# Patient Record
Sex: Female | Born: 2000 | Race: Black or African American | Hispanic: No | Smoking: Former smoker
Health system: Southern US, Community
[De-identification: ages and names within clinical notes are randomized; demographics above are authoritative.]

## PROBLEM LIST (undated history)

## (undated) DIAGNOSIS — J45909 Unspecified asthma, uncomplicated: Secondary | ICD-10-CM

## (undated) DIAGNOSIS — F329 Major depressive disorder, single episode, unspecified: Secondary | ICD-10-CM

## (undated) DIAGNOSIS — F32A Depression, unspecified: Secondary | ICD-10-CM

## (undated) HISTORY — PX: OTHER SURGICAL HISTORY: SHX169

---
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
## 2019-04-07 ENCOUNTER — Ambulatory Visit: Payer: Medicaid Other

## 2019-04-09 ENCOUNTER — Ambulatory Visit: Payer: Medicaid Other

## 2019-04-14 ENCOUNTER — Ambulatory Visit: Payer: Medicaid Other

## 2019-06-05 ENCOUNTER — Encounter: Payer: Self-pay | Admitting: Emergency Medicine

## 2019-06-05 ENCOUNTER — Emergency Department
Admission: EM | Admit: 2019-06-05 | Discharge: 2019-06-05 | Disposition: A | Discharge status: Home / Self Care | Payer: Medicaid Other | Attending: Emergency Medicine | Admitting: Emergency Medicine

## 2019-06-05 ENCOUNTER — Other Ambulatory Visit: Payer: Self-pay

## 2019-06-05 ENCOUNTER — Emergency Department: Payer: Medicaid Other

## 2019-06-05 DIAGNOSIS — J9801 Acute bronchospasm: Secondary | ICD-10-CM | POA: Insufficient documentation

## 2019-06-05 DIAGNOSIS — R509 Fever, unspecified: Secondary | ICD-10-CM | POA: Insufficient documentation

## 2019-06-05 DIAGNOSIS — M791 Myalgia, unspecified site: Secondary | ICD-10-CM | POA: Insufficient documentation

## 2019-06-05 DIAGNOSIS — R232 Flushing: Secondary | ICD-10-CM | POA: Insufficient documentation

## 2019-06-05 DIAGNOSIS — R0981 Nasal congestion: Secondary | ICD-10-CM

## 2019-06-05 DIAGNOSIS — Z79899 Other long term (current) drug therapy: Secondary | ICD-10-CM | POA: Insufficient documentation

## 2019-06-05 DIAGNOSIS — R05 Cough: Secondary | ICD-10-CM | POA: Diagnosis present

## 2019-06-05 MED ORDER — METHYLPREDNISOLONE 4 MG PO TBPK: ORAL_TABLET | ORAL | 0 refills | Status: DC

## 2019-06-05 MED ORDER — BENZONATATE 100 MG PO CAPS: 200.0000 mg | ORAL_CAPSULE | Freq: Three times a day (TID) | ORAL | 0 refills | Status: AC | PRN

## 2019-06-05 MED ORDER — ALBUTEROL SULFATE HFA 108 (90 BASE) MCG/ACT IN AERS: 2.0000 | INHALATION_SPRAY | Freq: Four times a day (QID) | RESPIRATORY_TRACT | 1 refills | Status: DC | PRN

## 2019-06-05 MED ORDER — FEXOFENADINE-PSEUDOEPHED ER 60-120 MG PO TB12: 1.0000 | ORAL_TABLET | Freq: Two times a day (BID) | ORAL | 0 refills | Status: DC

## 2019-06-05 NOTE — ED Triage Notes (Signed)
Pt reports cough, SOB and feeling hot since yesterday. Pt denies recent exposures and is unsure of fevers.

## 2019-06-05 NOTE — ED Notes (Signed)
See triage note  Presents with prod cough and some discomfort in chest  Esp with inspiration  Unsure of fever but did have "hot" sensation

## 2019-06-05 NOTE — ED Provider Notes (Signed)
[facility] Emergency Department Provider Note   ____________________________________________   First MD Initiated Contact with Patient 06/05/19 [phone]     (approximate)  I have reviewed the triage vital signs and the nursing notes.   HISTORY  Chief Complaint Hot Flashes, Cough, Generalized Body Aches, and Shortness of Breath    HPI Judith Henson is a 18 y.o. female patient presents with 2 days of productive cough, chest congestion, and subjective fever.  Patient denies nausea, vomiting, diarrhea.  Patient denies recent travel or known exposure to COVID-19.  Patient rates her pain discomfort 8/10.  Patient described the pain is "achy".  No palliative measures for complaint.         Past Medical History:  Diagnosis Date  . Asthma   . Depression     Patient Active Problem List   Diagnosis Date Noted  . MDD (major depressive disorder) 06/09/2015    Past Surgical History:  Procedure Laterality Date  . left wrist surgery      Prior to Admission medications   Medication Sig Start Date End Date Taking? Authorizing Provider  benzonatate (TESSALON PERLES) 100 MG capsule Take 2 capsules (200 mg total) by mouth 3 (three) times daily as needed. 06/05/19 06/04/20  Sable Feil, PA-C  etonogestrel (NEXPLANON) 68 MG IMPL implant 1 each by Subdermal route once. 12/07/17   Caren Macadam, MD  fexofenadine-pseudoephedrine (ALLEGRA-D) 60-120 MG 12 hr tablet Take 1 tablet by mouth 2 (two) times daily. 06/05/19   Sable Feil, PA-C  methylPREDNISolone (MEDROL DOSEPAK) 4 MG TBPK tablet Take Tapered dose as directed 06/05/19   Sable Feil, PA-C    Allergies Patient has no known allergies.  No family history on file.  Social History Social History   Tobacco Use  . Smoking status: Never Smoker  . Smokeless tobacco: Never Used  Substance Use Topics  . Alcohol use: No  . Drug use: No    Review of Systems Constitutional: No fever/chills  Eyes: No visual changes. ENT: No sore throat.  Nasal congestion. Cardiovascular: Denies chest pain. Respiratory: Mild shortness of breath and productive cough. Gastrointestinal: No abdominal pain.  No nausea, no vomiting.  No diarrhea.  No constipation. Genitourinary: Negative for dysuria. Musculoskeletal: Negative for back pain. Skin: Negative for rash. Neurological: Negative for headaches, focal weakness or numbness.   ____________________________________________   PHYSICAL EXAM:  VITAL SIGNS: ED Triage Vitals  Enc Vitals Group     BP 06/05/19 0806 136/70     Pulse Rate 06/05/19 0806 94     Resp --      Temp 06/05/19 0806 98.9 F (37.2 C)     Temp Source 06/05/19 0806 Oral     SpO2 06/05/19 0806 94 %     Weight 06/05/19 0805 120 lb (54.4 kg)     Height 06/05/19 0805 5\' 5"  (1.651 m)     Head Circumference --      Peak Flow --      Pain Score 06/05/19 0805 8     Pain Loc --      Pain Edu? --      Excl. in [place]? --     Constitutional: Alert and oriented. Well appearing and in no acute distress. Nose: Edematous nasal turbinates. Mouth/Throat: Mucous membranes are moist.  Oropharynx non-erythematous.  Postnasal drainage. Neck: No stridor.  Hematological/Lymphatic/Immunilogical: No cervical lymphadenopathy. Cardiovascular: Normal rate, regular rhythm. Grossly normal heart sounds.  Good peripheral circulation. Respiratory: Cough with deep inspirations.  No retractions. Lungs bilateral rales.   Gastrointestinal: Soft and nontender. No distention. No abdominal bruits. No CVA tenderness. Musculoskeletal: No lower extremity tenderness nor edema.  No joint effusions. Neurologic:  Normal speech and language. No gross focal neurologic deficits are appreciated. No gait instability. Skin:  Skin is warm, dry and intact. No rash noted. Psychiatric: Mood and affect are normal. Speech and behavior are normal.  ____________________________________________   LABS (all labs ordered are  listed, but only abnormal results are displayed)  Labs Reviewed - No data to display ____________________________________________  EKG   ____________________________________________  RADIOLOGY  ED MD interpretation:    Official radiology report(s): Dg Chest 2 View  Result Date: 06/05/2019 CLINICAL DATA:  Productive cough.  Fever.  Abnormal breath sounds. EXAM: CHEST - 2 VIEW COMPARISON:  No prior. FINDINGS: Mediastinum hilar structures normal. Very mild peribronchial cuffing noted. Bronchitis cannot be excluded. No focal alveolar infiltrate. No pleural effusion or pneumothorax. S shaped thoracic spine scoliosis. No acute bony abnormality. IMPRESSION: Very mild peribronchial cuffing noted. Bronchitis cannot be excluded. No focal alveolar infiltrate identified. Electronically Signed   By: Maisie Fus  Register   On: 06/05/2019 09:13    ____________________________________________   PROCEDURES  Procedure(s) performed (including Critical Care):  Procedures   ____________________________________________   INITIAL IMPRESSION / ASSESSMENT AND PLAN / ED COURSE  As part of my medical decision making, I reviewed the following data within the electronic MEDICAL RECORD NUMBER         Judith Henson was evaluated in Emergency Department on 06/05/2019 for the symptoms described in the history of present illness. She was evaluated in the context of the global COVID-19 pandemic, which necessitated consideration that the patient might be at risk for infection with the SARS-CoV-2 virus that causes COVID-19. Institutional protocols and algorithms that pertain to the evaluation of patients at risk for COVID-19 are in a state of rapid change based on information released by regulatory bodies including the CDC and federal and state organizations. These policies and algorithms were followed during the patient's care in the ED.  Patient presents with 2 days of productive cough and chest congestion.   Discussed x-ray findings with patient consistent with bronchitis.  Patient given discharge care instruction work note.  Patient vies take medication as directed.  Patient [facility].      ____________________________________________   FINAL CLINICAL IMPRESSION(S) / ED DIAGNOSES  Final diagnoses:  Cough due to bronchospasm  Congestion of nasal sinus     ED Discharge Orders         Ordered    methylPREDNISolone (MEDROL DOSEPAK) 4 MG TBPK tablet     06/05/19 0920    benzonatate (TESSALON PERLES) 100 MG capsule  3 times daily PRN     06/05/19 0920    fexofenadine-pseudoephedrine (ALLEGRA-D) 60-120 MG 12 hr tablet  2 times daily     06/05/19 0920           Note:  This document was prepared using Dragon voice recognition software and may include unintentional dictation errors.    Joni Reining, PA-C 06/05/19 5597    Chesley Noon, MD 06/05/19 1550

## 2019-06-05 NOTE — Discharge Instructions (Signed)
Take medication as directed.

## 2019-06-26 ENCOUNTER — Other Ambulatory Visit: Payer: Self-pay

## 2019-06-26 ENCOUNTER — Telehealth: Payer: Medicaid Other

## 2019-06-26 ENCOUNTER — Emergency Department
Admission: EM | Admit: 2019-06-26 | Discharge: 2019-06-27 | Disposition: A | Discharge status: Home / Self Care | Payer: Medicaid Other | Attending: Emergency Medicine | Admitting: Emergency Medicine

## 2019-06-26 ENCOUNTER — Emergency Department: Payer: Medicaid Other

## 2019-06-26 DIAGNOSIS — R05 Cough: Secondary | ICD-10-CM | POA: Diagnosis present

## 2019-06-26 DIAGNOSIS — Z20828 Contact with and (suspected) exposure to other viral communicable diseases: Secondary | ICD-10-CM | POA: Diagnosis not present

## 2019-06-26 DIAGNOSIS — J45909 Unspecified asthma, uncomplicated: Secondary | ICD-10-CM | POA: Diagnosis not present

## 2019-06-26 DIAGNOSIS — Z79899 Other long term (current) drug therapy: Secondary | ICD-10-CM | POA: Insufficient documentation

## 2019-06-26 DIAGNOSIS — J069 Acute upper respiratory infection, unspecified: Secondary | ICD-10-CM | POA: Insufficient documentation

## 2019-06-26 DIAGNOSIS — Z20822 Contact with and (suspected) exposure to covid-19: Secondary | ICD-10-CM

## 2019-06-26 MED ORDER — PREDNISONE 20 MG PO TABS
60.0000 mg | ORAL_TABLET | Freq: Once | ORAL | Status: AC
  Administered 2019-06-27: 60 mg via ORAL
  Filled 2019-06-26: qty 3

## 2019-06-26 MED ORDER — AZITHROMYCIN 500 MG PO TABS
500.0000 mg | ORAL_TABLET | Freq: Once | ORAL | Status: AC
  Administered 2019-06-27: 500 mg via ORAL
  Filled 2019-06-26: qty 1

## 2019-06-26 MED ORDER — IPRATROPIUM-ALBUTEROL 0.5-2.5 (3) MG/3ML IN SOLN: 3.0000 mL | Freq: Once | RESPIRATORY_TRACT | Status: DC

## 2019-06-26 NOTE — ED Triage Notes (Addendum)
Pt in [place] cough and shob, dx with bronchitis 2 weeks ago. Was given steroids but no antibiotics. Pt had covid test done this am at the drive through, pending results.

## 2019-06-27 LAB — POC SARS CORONAVIRUS 2 AG: SARS Coronavirus 2 Ag: NEGATIVE

## 2019-06-27 MED ORDER — AZITHROMYCIN 500 MG PO TABS: 500.0000 mg | ORAL_TABLET | Freq: Every day | ORAL | 0 refills | Status: AC

## 2019-06-27 MED ORDER — PREDNISONE 20 MG PO TABS: 60.0000 mg | ORAL_TABLET | Freq: Every day | ORAL | 0 refills | Status: AC

## 2019-06-27 MED ORDER — IPRATROPIUM-ALBUTEROL 20-100 MCG/ACT IN AERS
1.0000 | INHALATION_SPRAY | Freq: Four times a day (QID) | RESPIRATORY_TRACT | Status: DC | PRN
  Administered 2019-06-27: 01:00:00 1 via RESPIRATORY_TRACT
  Filled 2019-06-27: qty 4

## 2019-06-27 MED ORDER — ALBUTEROL SULFATE HFA 108 (90 BASE) MCG/ACT IN AERS: 2.0000 | INHALATION_SPRAY | Freq: Four times a day (QID) | RESPIRATORY_TRACT | 1 refills | Status: DC | PRN

## 2019-06-27 MED ORDER — AZITHROMYCIN 500 MG PO TABS: 500.0000 mg | ORAL_TABLET | Freq: Once | ORAL | Status: DC

## 2019-06-27 MED ORDER — BENZONATATE 100 MG PO CAPS: 100.0000 mg | ORAL_CAPSULE | Freq: Three times a day (TID) | ORAL | 0 refills | Status: AC | PRN

## 2019-06-27 NOTE — ED Provider Notes (Signed)
[facility] Emergency Department Provider Note    First MD Initiated Contact with Patient 06/27/19 0003     (approximate)  I have reviewed the triage vital signs and the nursing notes.   HISTORY  Chief Complaint Cough  HPI Judith Henson is a 18 y.o. female with below list of previous medical conditions presents to the emergency department secondary to cough and dyspnea x2 weeks.  Patient denies any fever or known known sick contact.  Patient was seen in the emergency department and diagnosed with bronchitis for which she was prescribed steroids but no antibiotics or any other treatment.  Patient states that symptoms have been persistent.  Patient denies any chest pain.       Past Medical History:  Diagnosis Date  . Asthma   . Depression     Patient Active Problem List   Diagnosis Date Noted  . MDD (major depressive disorder) 06/09/2015    Past Surgical History:  Procedure Laterality Date  . left wrist surgery      Prior to Admission medications   Medication Sig Start Date End Date Taking? Authorizing Provider  albuterol (VENTOLIN HFA) 108 (90 Base) MCG/ACT inhaler Inhale 2 puffs into the lungs every 6 (six) hours as needed for wheezing or shortness of breath. 06/05/19   Joni Reining, PA-C  benzonatate (TESSALON PERLES) 100 MG capsule Take 2 capsules (200 mg total) by mouth 3 (three) times daily as needed. 06/05/19 06/04/20  Joni Reining, PA-C  etonogestrel (NEXPLANON) 68 MG IMPL implant 1 each by Subdermal route once. 12/07/17   Federico Flake, MD  fexofenadine-pseudoephedrine (ALLEGRA-D) 60-120 MG 12 hr tablet Take 1 tablet by mouth 2 (two) times daily. 06/05/19   Joni Reining, PA-C  methylPREDNISolone (MEDROL DOSEPAK) 4 MG TBPK tablet Take Tapered dose as directed 06/05/19   Joni Reining, PA-C    Allergies Patient has no known allergies.  No family history on file.  Social History Social History   Tobacco Use  .  Smoking status: Never Smoker  . Smokeless tobacco: Never Used  Substance Use Topics  . Alcohol use: No  . Drug use: No    Review of Systems Constitutional: No fever/chills Eyes: No visual changes. ENT: No sore throat. Cardiovascular: Denies chest pain. Respiratory: Positive for dyspnea and cough Gastrointestinal: No abdominal pain.  No nausea, no vomiting.  No diarrhea.  No constipation. Genitourinary: Negative for dysuria. Musculoskeletal: Negative for neck pain.  Negative for back pain. Integumentary: Negative for rash. Neurological: Negative for headaches, focal weakness or numbness.  ____________________________________________   PHYSICAL EXAM:  VITAL SIGNS: ED Triage Vitals  Enc Vitals Group     BP 06/26/19 2104 120/72     Pulse Rate 06/26/19 2104 84     Resp 06/26/19 2104 20     Temp 06/26/19 2104 98.3 F (36.8 C)     Temp Source 06/26/19 2104 Oral     SpO2 06/26/19 2104 95 %     Weight 06/26/19 2104 54.9 kg (121 lb)     Height 06/26/19 2104 1.651 m (5\' 5" )     Head Circumference --      Peak Flow --      Pain Score 06/26/19 2110 0     Pain Loc --      Pain Edu? --      Excl. in [place]? --     Constitutional: Alert and oriented.  Eyes: Conjunctivae are normal.  Mouth/Throat: Patient is wearing a  mask. Neck: No stridor.  No meningeal signs.   Cardiovascular: Normal rate, regular rhythm. Good peripheral circulation. Grossly normal heart sounds. Respiratory: Normal respiratory effort.  Diffuse rhonchi with coarse expiratory wheezing. Gastrointestinal: Soft and nontender. No distention.  Musculoskeletal: No lower extremity tenderness nor edema. No gross deformities of extremities. Neurologic:  Normal speech and language. No gross focal neurologic deficits are appreciated.  Skin:  Skin is warm, dry and intact. Psychiatric: Mood and affect are normal. Speech and behavior are normal.  ____________________________________________   LABS (all labs ordered are  listed, but only abnormal results are displayed)  Labs Reviewed  POC SARS CORONAVIRUS 2 AG -  ED   ____________________________________________  EKG    RADIOLOGY I, [place], personally viewed and evaluated these images (plain radiographs) as part of my medical decision making, as well as reviewing the written report by the radiologist.  ED MD interpretation:    Official radiology report(s): Dg Chest 2 View  Result Date: 06/26/2019 CLINICAL DATA:  Cough EXAM: CHEST - 2 VIEW COMPARISON:  June 05, 2019 FINDINGS: The heart size and mediastinal contours are within normal limits. Both lungs are clear. The visualized skeletal structures are unremarkable. IMPRESSION: No active cardiopulmonary disease. Electronically Signed   By: Prudencio Pair M.D.   On: 06/26/2019 23:48     Procedures   ____________________________________________   INITIAL IMPRESSION / MDM / ASSESSMENT AND PLAN / ED COURSE  As part of my medical decision making, I reviewed the following data within the electronic MEDICAL RECORD NUMBER   18 year old female presented with above-stated history and physical exam concerning for possible URI bronchitis pneumonia as well as COVID-19.  Chest x-ray revealed no active cardiopulmonary disease per radiologist.  Point-of-care Covid testing negative however given patient extended course of symptoms false-negative likelihood is also high.  Patient did have COVID-19 testing performed yesterday and is pending at this time.  Patient given azithromycin and albuterol breathing treatment the emergency department.  Patient will be prescribed albuterol prednisone Tessalon for home.  Patient advised to follow-up on COVID-19 testing.        ____________________________________________  FINAL CLINICAL IMPRESSION(S) / ED DIAGNOSES  Final diagnoses:  Acute upper respiratory infection  Suspected COVID-19 virus infection     MEDICATIONS GIVEN DURING THIS VISIT:  Medications   ipratropium-albuterol (DUONEB) 0.5-2.5 (3) MG/3ML nebulizer solution 3 mL (has no administration in time range)  azithromycin (ZITHROMAX) tablet 500 mg (has no administration in time range)  predniSONE (DELTASONE) tablet 60 mg (has no administration in time range)     ED Discharge Orders    None      *Please note:  Mckynzi Shamoon was evaluated in Emergency Department on 06/27/2019 for the symptoms described in the history of present illness. She was evaluated in the context of the global COVID-19 pandemic, which necessitated consideration that the patient might be at risk for infection with the SARS-CoV-2 virus that causes COVID-19. Institutional protocols and algorithms that pertain to the evaluation of patients at risk for COVID-19 are in a state of rapid change based on information released by regulatory bodies including the CDC and federal and state organizations. These policies and algorithms were followed during the patient's care in the ED.  Some ED evaluations and interventions may be delayed as a result of limited staffing during the pandemic.*  Note:  This document was prepared using Dragon voice recognition software and may include unintentional dictation errors.   Gregor Hams, MD 06/27/19 0200

## 2019-06-27 NOTE — ED Notes (Signed)
covid negative. md notified.

## 2019-06-28 LAB — NOVEL CORONAVIRUS, NAA: SARS-CoV-2, NAA: NOT DETECTED

## 2019-10-08 ENCOUNTER — Other Ambulatory Visit: Payer: Self-pay

## 2019-10-08 ENCOUNTER — Encounter: Payer: Self-pay | Admitting: Physician Assistant

## 2019-10-08 ENCOUNTER — Ambulatory Visit: Payer: Medicaid Other

## 2019-10-08 ENCOUNTER — Ambulatory Visit (LOCAL_COMMUNITY_HEALTH_CENTER): Payer: Medicaid Other | Admitting: Physician Assistant

## 2019-10-08 VITALS — BP 116/72 | Ht 65.0 in | Wt 122.2 lb

## 2019-10-08 DIAGNOSIS — Z30013 Encounter for initial prescription of injectable contraceptive: Secondary | ICD-10-CM

## 2019-10-08 DIAGNOSIS — Z3046 Encounter for surveillance of implantable subdermal contraceptive: Secondary | ICD-10-CM | POA: Diagnosis not present

## 2019-10-08 DIAGNOSIS — Z3009 Encounter for other general counseling and advice on contraception: Secondary | ICD-10-CM

## 2019-10-08 MED ORDER — MEDROXYPROGESTERONE ACETATE 150 MG/ML IM SUSP
150.0000 mg | INTRAMUSCULAR | Status: AC
  Administered 2019-10-08: 150 mg via INTRAMUSCULAR

## 2019-10-08 MED ORDER — THERA VITAL M PO TABS: 1.0000 | ORAL_TABLET | Freq: Every day | ORAL | 0 refills | Status: DC

## 2019-10-08 NOTE — Progress Notes (Signed)
Patient given Depo per provider orders. Given right gluteus muscle, tolerated well, next Depo card given. Patient given MVI and PCP list..Eriel Dunckel Babs Sciara, RN

## 2019-10-08 NOTE — Progress Notes (Signed)
Patient here for Nexplanon removal, states menstrual irregularities and mood swings. Considering Depo.Burt Knack, RN

## 2019-10-10 NOTE — Progress Notes (Signed)
Family Planning Visit  Subjective:  Judith Henson is a 19 y.o. being seen today for a Nexplanon removal and to discuss changing to another methos.    She is currently using Nexplanon for pregnancy prevention. Patient reports she does not  want a pregnancy in the next year. Patient  has MDD (major depressive disorder) on their problem list.  Chief Complaint  Patient presents with  . Contraception    Patient reports that she has had irregular bleeding and moodiness with the Nexplanon.  States that she has had one Depo, prior to having the Nexplanon placed.  States that she does not think she would be able to keep up with taking OCs.  Patient denies any changes to her history; no chronic conditions, meds, smoking, clotting, migraines.  Denies vaginal symptoms and declines STD screening today.   Does the patient desire a pregnancy in the next year? (OKQ flowsheet)  See flowsheet for other program required questions.   Body mass index is 20.34 kg/m. - Patient is eligible for diabetes screening based on BMI and age >24?  not applicable MW1U ordered? not applicable  Patient reports 1 of partners in last year. Desires STI screening?  No - .  Does the patient have a current or past history of drug use? No   No components found for: HCV]   Health Maintenance Due  Topic Date Due  . TETANUS/TDAP  Never done    Review of Systems  All other systems reviewed and are negative.   The following portions of the patient's history were reviewed and updated as appropriate: allergies, current medications, past family history, past medical history, past social history, past surgical history and problem list. Problem list updated.  Objective:   Vitals:   10/08/19 0942  BP: 116/72  Weight: 122 lb 3.2 oz (55.4 kg)  Height: 5\' 5"  (1.651 m)    Physical Exam Constitutional:      General: She is not in acute distress.    Appearance: Normal appearance. She is normal weight.  HENT:     Head:  Normocephalic and atraumatic.  Eyes:     Conjunctiva/sclera: Conjunctivae normal.  Pulmonary:     Effort: Pulmonary effort is normal.  Neurological:     Mental Status: She is alert and oriented to person, place, and time.  Psychiatric:        Mood and Affect: Mood normal.        Behavior: Behavior normal.        Thought Content: Thought content normal.        Judgment: Judgment normal.       Assessment and Plan:  Judith Henson is a 19 y.o. female presenting to the [facility] Department for a family planning visit  Contraception counseling: Reviewed all forms of birth control options in the tiered based approach. available including abstinence; over the counter/barrier methods; hormonal contraceptive medication including pill, patch, ring, injection,contraceptive implant; hormonal and nonhormonal IUDs; permanent sterilization options including vasectomy and the various tubal sterilization modalities. Risks, benefits, and typical effectiveness rates were reviewed.  Questions were answered.  Written information was also given to the patient to review.  Patient desires to start Depo, this was prescribed for patient. She will follow up in  3 months and prn for surveillance.  She was told to call with any further questions, or with any concerns about this method of contraception.  Emphasized use of condoms 100% of the time for STI prevention.  Patient was not a  candidate for ECP today.    1. Encounter for counseling regarding contraception Counseled patient re:  BCMs and differences in Nexplanon and Depo. Patient opts to get Depo to use as BCM since does not think she could take OC correctly. Rec condoms with all sex, and especially for 2 weeks after first shot. - medroxyPROGESTERone (DEPO-PROVERA) injection 150 mg  2. Nexplanon removal Nexplanon Removal Patient identified, informed consent performed, consent signed.   Appropriate time out taken. Nexplanon site identified.   Area prepped in usual sterile fashon. 3 ml of 1% lidocaine with Epinephrine was used to anesthetize the area at the distal end of the implant and along implant site. A small stab incision was made right beside the implant on the distal portion.  The Nexplanon rod was grasped manuallyand removed without difficulty.  There was minimal blood loss. There were no complications.  Steri-strips were applied over the small incision.  A pressure bandage was applied to reduce any bruising.  The patient tolerated the procedure well and was given post procedure instructions.    Nexplanon:   Counseled patient to take OTC analgesic starting as soon as lidocaine starts to wear off and take regularly for at least 48 hr to decrease discomfort.  Specifically to take with food or milk to decrease stomach upset and for IB 600 mg (3 tablets) every 6 hrs; IB 800 mg (4 tablets) every 8 hrs; or Aleve 2 tablets every 12 hrs.   - Multiple Vitamins-Minerals (MULTIVITAMIN) tablet; Take 1 tablet by mouth daily.  Dispense: 100 tablet; Refill: 0  3. Initiation of Depo Provera OK to start Depo 150mg  IM q 11-13 weeks for 1 year. Counseled patient re:  Normal SE and when to call clinic with irregular bleeding. Counseled patient that sometimes hormonal BCM can affect B vitamins and these can affect mood.  Rec that patient take MVI 1 po daily to see if this will help with moodiness. Also, offer LCSW info, but patient declines at this time. - medroxyPROGESTERone (DEPO-PROVERA) injection 150 mg - Multiple Vitamins-Minerals (MULTIVITAMIN) tablet; Take 1 tablet by mouth daily.  Dispense: 100 tablet; Refill: 0     No follow-ups on file.  No future appointments.  , PA

## 2019-12-07 ENCOUNTER — Other Ambulatory Visit: Payer: Self-pay

## 2019-12-07 ENCOUNTER — Emergency Department
Admission: EM | Admit: 2019-12-07 | Discharge: 2019-12-07 | Disposition: A | Discharge status: Home / Self Care | Payer: Medicaid Other | Attending: Emergency Medicine | Admitting: Emergency Medicine

## 2019-12-07 ENCOUNTER — Encounter: Payer: Self-pay | Admitting: Emergency Medicine

## 2019-12-07 DIAGNOSIS — Y939 Activity, unspecified: Secondary | ICD-10-CM | POA: Insufficient documentation

## 2019-12-07 DIAGNOSIS — M7918 Myalgia, other site: Secondary | ICD-10-CM | POA: Insufficient documentation

## 2019-12-07 DIAGNOSIS — Y999 Unspecified external cause status: Secondary | ICD-10-CM | POA: Insufficient documentation

## 2019-12-07 DIAGNOSIS — J45909 Unspecified asthma, uncomplicated: Secondary | ICD-10-CM | POA: Insufficient documentation

## 2019-12-07 DIAGNOSIS — Y92410 Unspecified street and highway as the place of occurrence of the external cause: Secondary | ICD-10-CM | POA: Diagnosis not present

## 2019-12-07 LAB — URINALYSIS, COMPLETE (UACMP) WITH MICROSCOPIC
Bilirubin Urine: NEGATIVE
Glucose, UA: NEGATIVE mg/dL
Hgb urine dipstick: NEGATIVE
Ketones, ur: NEGATIVE mg/dL
Leukocytes,Ua: NEGATIVE
Nitrite: NEGATIVE
Protein, ur: 30 mg/dL — AB
Specific Gravity, Urine: 1.017 (ref 1.005–1.030)
pH: 6 (ref 5.0–8.0)

## 2019-12-07 LAB — POCT PREGNANCY, URINE: Preg Test, Ur: NEGATIVE

## 2019-12-07 MED ORDER — CYCLOBENZAPRINE HCL 5 MG PO TABS: 5.0000 mg | ORAL_TABLET | Freq: Three times a day (TID) | ORAL | 0 refills | Status: DC | PRN

## 2019-12-07 MED ORDER — LIDOCAINE 5 % EX PTCH: 1.0000 | MEDICATED_PATCH | CUTANEOUS | 0 refills | Status: AC
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
## 2020-02-12 ENCOUNTER — Ambulatory Visit: Payer: Medicaid Other | Admitting: Obstetrics

## 2020-02-29 ENCOUNTER — Emergency Department
Admission: EM | Admit: 2020-02-29 | Discharge: 2020-02-29 | Discharge status: Against Medical Advice | Payer: No Typology Code available for payment source

## 2020-10-12 ENCOUNTER — Emergency Department
Admission: EM | Admit: 2020-10-12 | Discharge: 2020-10-12 | Disposition: A | Discharge status: Home / Self Care | Payer: Medicaid Other | Attending: Emergency Medicine | Admitting: Emergency Medicine

## 2020-10-12 ENCOUNTER — Other Ambulatory Visit: Payer: Self-pay

## 2020-10-12 ENCOUNTER — Emergency Department: Payer: Medicaid Other

## 2020-10-12 DIAGNOSIS — J209 Acute bronchitis, unspecified: Secondary | ICD-10-CM

## 2020-10-12 DIAGNOSIS — Z20822 Contact with and (suspected) exposure to covid-19: Secondary | ICD-10-CM | POA: Diagnosis not present

## 2020-10-12 DIAGNOSIS — J45909 Unspecified asthma, uncomplicated: Secondary | ICD-10-CM | POA: Insufficient documentation

## 2020-10-12 DIAGNOSIS — R059 Cough, unspecified: Secondary | ICD-10-CM | POA: Diagnosis present

## 2020-10-12 LAB — RESP PANEL BY RT-PCR (FLU A&B, COVID) ARPGX2
Influenza A by PCR: NEGATIVE
Influenza B by PCR: NEGATIVE
SARS Coronavirus 2 by RT PCR: NEGATIVE

## 2020-10-12 MED ORDER — ALBUTEROL SULFATE HFA 108 (90 BASE) MCG/ACT IN AERS: 2.0000 | INHALATION_SPRAY | Freq: Four times a day (QID) | RESPIRATORY_TRACT | 2 refills | Status: DC | PRN

## 2020-10-12 MED ORDER — IPRATROPIUM-ALBUTEROL 0.5-2.5 (3) MG/3ML IN SOLN
3.0000 mL | Freq: Once | RESPIRATORY_TRACT | Status: AC
  Administered 2020-10-12: 3 mL via RESPIRATORY_TRACT
  Filled 2020-10-12: qty 3

## 2020-10-12 MED ORDER — AZITHROMYCIN 250 MG PO TABS: ORAL_TABLET | ORAL | 0 refills | Status: DC

## 2020-10-12 NOTE — ED Notes (Signed)
See triage note  Presents with some SOB and cough  States she thought it was her allergies   But states she doesn't feel any better  No fever

## 2020-10-12 NOTE — ED Triage Notes (Signed)
Pt comes with c/o cough and allergy issues for few days. Pt states her allergies have been acting up. Pt states dry cough. Pt denies any fevers.

## 2020-10-12 NOTE — ED Provider Notes (Signed)
[facility] Emergency Department Provider Note  ____________________________________________   Event Date/Time   First MD Initiated Contact with Patient 10/12/20 1440     (approximate)  I have reviewed the triage vital signs and the nursing notes.   HISTORY  Chief Complaint Cough    HPI Judith Henson is a 20 y.o. female presents to the emergency department with URI symptoms for 3-4 days.   Is complaining of cough, congestion, denies fever, denies chills, denies chest pain, positive wheezing and shortness of breath denies close contact with Covid19+ patient, patient states she used to have asthma as a child.   Past Medical History:  Diagnosis Date  . Asthma   . Depression     Patient Active Problem List   Diagnosis Date Noted  . MDD (major depressive disorder) 06/09/2015    Past Surgical History:  Procedure Laterality Date  . left wrist surgery      Prior to Admission medications   Medication Sig Start Date End Date Taking? Authorizing Provider  albuterol (VENTOLIN HFA) 108 (90 Base) MCG/ACT inhaler Inhale 2 puffs into the lungs every 6 (six) hours as needed for wheezing or shortness of breath. 10/12/20  Yes Lea Walbert, Roselyn Bering, PA-C  azithromycin (ZITHROMAX Z-PAK) 250 MG tablet 2 pills today then 1 pill a day for 4 days 10/12/20  Yes Cerita Rabelo, Roselyn Bering, PA-C  Multiple Vitamins-Minerals (MULTIVITAMIN) tablet Take 1 tablet by mouth daily. 10/08/19   Federico Flake, MD    Allergies Patient has no known allergies.  No family history on file.  Social History Social History   Tobacco Use  . Smoking status: Never Smoker  . Smokeless tobacco: Never Used  Vaping Use  . Vaping Use: Never used  Substance Use Topics  . Alcohol use: No  . Drug use: No    Review of Systems  Constitutional: Denies fever/chills Eyes: No visual changes. ENT: Denies sore throat. Respiratory: Positive cough Cardiovascular: Denies chest pain Gastrointestinal:  Denies abdominal pain Genitourinary: Negative for dysuria. Musculoskeletal: Negative for back pain. Skin: Negative for rash. Neurological: Denies neurological changes    ____________________________________________   PHYSICAL EXAM:  VITAL SIGNS: ED Triage Vitals  Enc Vitals Group     BP 10/12/20 1344 125/72     Pulse Rate 10/12/20 1344 88     Resp 10/12/20 1344 17     Temp 10/12/20 1344 98.3 F (36.8 C)     Temp Source 10/12/20 1344 Oral     SpO2 10/12/20 1344 96 %     Weight 10/12/20 1420 119 lb 14.9 oz (54.4 kg)     Height 10/12/20 1420 5\' 5"  (1.651 m)     Head Circumference --      Peak Flow --      Pain Score 10/12/20 1346 3     Pain Loc --      Pain Edu? --      Excl. in GC? --     Constitutional: Alert and oriented. Well appearing and in no acute distress. Eyes: Conjunctivae are normal.  Head: Atraumatic. Nose: No congestion/rhinnorhea. Mouth/Throat: Mucous membranes are moist.   Neck:  supple no lymphadenopathy noted Cardiovascular: Normal rate, regular rhythm. Heart sounds are normal Respiratory: Normal respiratory effort.  No retractions, lungs with decreased breath sounds and wheezing in the left lower lung GU: deferred Musculoskeletal: FROM all extremities, warm and well perfused Neurologic:  Normal speech and language.  Skin:  Skin is warm, dry and intact. No rash noted. Psychiatric:  Mood and affect are normal. Speech and behavior are normal.  ____________________________________________   LABS (all labs ordered are listed, but only abnormal results are displayed)  Labs Reviewed  RESP PANEL BY RT-PCR (FLU A&B, COVID) ARPGX2   ____________________________________________   ____________________________________________  RADIOLOGY  Chest x-ray  ____________________________________________   PROCEDURES  Procedure(s) performed: DuoNeb  Procedures    ____________________________________________   INITIAL IMPRESSION / ASSESSMENT AND  PLAN / ED COURSE  Pertinent labs & imaging results that were available during my care of the patient were reviewed by me and considered in my medical decision making (see chart for details).   Patient is a 20 year old female who complains of URI symptoms.  See HPI appears to be stable,  Chest x-ray, Covid test/flu test  Chest x-ray reviewed by me confirmed by radiology to have a infiltrate in right lower lung, Covid/flu test are negative  Did explain the findings to the patient.  Radiologist was unsure if it was atelectasis versus an infiltrate so we will cover her with a Z-Pak.  She was also given a prescription for an inhaler.  She is to take over-the-counter Mucinex.  Return emergency department worsening.  Follow-up with your regular doctor as needed.  She was discharged stable condition.    Judith Henson was evaluated in Emergency Department on 10/12/2020 for the symptoms described in the history of present illness. She was evaluated in the context of the global COVID-19 pandemic, which necessitated consideration that the patient might be at risk for infection with the SARS-CoV-2 virus that causes COVID-19. Institutional protocols and algorithms that pertain to the evaluation of patients at risk for COVID-19 are in a state of rapid change based on information released by regulatory bodies including the CDC and federal and state organizations. These policies and algorithms were followed during the patient's care in the ED.   As part of my medical decision making, I reviewed the following data within the electronic MEDICAL RECORD NUMBER Nursing notes reviewed and incorporated, Labs reviewed , Old chart reviewed, Radiograph reviewed , Notes from prior ED visits and [place] Controlled Substance Database  ____________________________________________   FINAL CLINICAL IMPRESSION(S) / ED DIAGNOSES  Final diagnoses:  Acute bronchitis, unspecified organism      NEW MEDICATIONS STARTED DURING THIS  VISIT:  Discharge Medication List as of 10/12/2020  4:37 PM       Note:  This document was prepared using Dragon voice recognition software and may include unintentional dictation errors.    Faythe Ghee, PA-C 10/12/20 1706    Minna Antis, MD 10/13/20 [phone]

## 2020-10-12 NOTE — Discharge Instructions (Addendum)
Take over-the-counter Mucinex, you can buy the generic brand. Use the inhaler as prescribed Return to the emergency department feel that you are worsening You may want to take an over-the-counter allergy medicine such as Claritin or Zyrtec

## 2020-12-10 ENCOUNTER — Encounter: Payer: Self-pay | Admitting: Emergency Medicine

## 2020-12-10 ENCOUNTER — Other Ambulatory Visit: Payer: Self-pay

## 2020-12-10 ENCOUNTER — Emergency Department
Admission: EM | Admit: 2020-12-10 | Discharge: 2020-12-10 | Disposition: A | Discharge status: Home / Self Care | Payer: Medicaid Other | Attending: Emergency Medicine | Admitting: Emergency Medicine

## 2020-12-10 DIAGNOSIS — R059 Cough, unspecified: Secondary | ICD-10-CM | POA: Diagnosis present

## 2020-12-10 DIAGNOSIS — Z20822 Contact with and (suspected) exposure to covid-19: Secondary | ICD-10-CM | POA: Diagnosis not present

## 2020-12-10 DIAGNOSIS — J45909 Unspecified asthma, uncomplicated: Secondary | ICD-10-CM | POA: Diagnosis not present

## 2020-12-10 DIAGNOSIS — B349 Viral infection, unspecified: Secondary | ICD-10-CM | POA: Insufficient documentation

## 2020-12-10 LAB — RESP PANEL BY RT-PCR (FLU A&B, COVID) ARPGX2
Influenza A by PCR: NEGATIVE
Influenza B by PCR: NEGATIVE
SARS Coronavirus 2 by RT PCR: NEGATIVE

## 2020-12-10 NOTE — ED Provider Notes (Signed)
[facility]-EMERGENCY DEPARTMENT  ____________________________________________  Time seen: Approximately 5:39 PM  I have reviewed the triage vital signs and the nursing notes.   HISTORY  Chief Complaint Chills and Generalized Body Aches   Historian Patient     HPI Judith Henson is a 20 y.o. female presents to the emergency department with body aches, chills, nasal congestion and cough that started yesterday.  Patient has no known sick contacts to her knowledge.  She denies chest pain, chest tightness or abdominal pain.  No emesis or diarrhea.  No rash.  No other alleviating measures have been attempted.   Past Medical History:  Diagnosis Date  . Asthma   . Depression      Immunizations up to date:  Yes.     Past Medical History:  Diagnosis Date  . Asthma   . Depression     Patient Active Problem List   Diagnosis Date Noted  . MDD (major depressive disorder) 06/09/2015    Past Surgical History:  Procedure Laterality Date  . left wrist surgery      Prior to Admission medications   Medication Sig Start Date End Date Taking? Authorizing Provider  albuterol (VENTOLIN HFA) 108 (90 Base) MCG/ACT inhaler Inhale 2 puffs into the lungs every 6 (six) hours as needed for wheezing or shortness of breath. 10/12/20   Sherrie Mustache Roselyn Bering, PA-C  azithromycin (ZITHROMAX Z-PAK) 250 MG tablet 2 pills today then 1 pill a day for 4 days 10/12/20   Faythe Ghee, PA-C  Multiple Vitamins-Minerals (MULTIVITAMIN) tablet Take 1 tablet by mouth daily. 10/08/19   Federico Flake, MD    Allergies Patient has no known allergies.  History reviewed. No pertinent family history.  Social History Social History   Tobacco Use  . Smoking status: Never Smoker  . Smokeless tobacco: Never Used  Vaping Use  . Vaping Use: Never used  Substance Use Topics  . Alcohol use: No  . Drug use: No     Review of Systems  Constitutional: Patient has chills.  Eyes:  No discharge ENT: No upper  respiratory complaints. Respiratory: no cough. No SOB/ use of accessory muscles to breath Gastrointestinal:   No nausea, no vomiting.  No diarrhea.  No constipation. Musculoskeletal: Patient has myalgias. Skin: Negative for rash, abrasions, lacerations, ecchymosis.    ____________________________________________   PHYSICAL EXAM:  VITAL SIGNS: ED Triage Vitals  Enc Vitals Group     BP 12/10/20 1614 116/82     Pulse Rate 12/10/20 1614 87     Resp 12/10/20 1614 20     Temp 12/10/20 1614 99.7 F (37.6 C)     Temp Source 12/10/20 1614 Oral     SpO2 12/10/20 1614 95 %     Weight 12/10/20 1615 120 lb (54.4 kg)     Height 12/10/20 1615 5\' 5"  (1.651 m)     Head Circumference --      Peak Flow --      Pain Score 12/10/20 1614 8     Pain Loc --      Pain Edu? --      Excl. in GC? --      Constitutional: Alert and oriented. Patient is lying supine. Eyes: Conjunctivae are normal. PERRL. EOMI. Head: Atraumatic. ENT:      Ears: Tympanic membranes are mildly injected with mild effusion bilaterally.       Nose: No congestion/rhinnorhea.      Mouth/Throat: Mucous membranes are moist. Posterior pharynx is mildly erythematous.  Hematological/Lymphatic/Immunilogical:  No cervical lymphadenopathy.  Cardiovascular: Normal rate, regular rhythm. Normal S1 and S2.  Good peripheral circulation. Respiratory: Normal respiratory effort without tachypnea or retractions. Lungs CTAB. Good air entry to the bases with no decreased or absent breath sounds. Gastrointestinal: Bowel sounds 4 quadrants. Soft and nontender to palpation. No guarding or rigidity. No palpable masses. No distention. No CVA tenderness. Musculoskeletal: Full range of motion to all extremities. No gross deformities appreciated. Neurologic:  Normal speech and language. No gross focal neurologic deficits are appreciated.  Skin:  Skin is warm, dry and intact. No rash noted. Psychiatric: Mood and affect are normal. Speech and behavior  are normal. Patient exhibits appropriate insight and judgement.    ____________________________________________   LABS (all labs ordered are listed, but only abnormal results are displayed)  Labs Reviewed  RESP PANEL BY RT-PCR (FLU A&B, COVID) ARPGX2   ____________________________________________  EKG   ____________________________________________  RADIOLOGY   No results found.  ____________________________________________    PROCEDURES  Procedure(s) performed:     Procedures     Medications - No data to display   ____________________________________________   INITIAL IMPRESSION / ASSESSMENT AND PLAN / ED COURSE  Pertinent labs & imaging results that were available during my care of the patient were reviewed by me and considered in my medical decision making (see chart for details).      Assessment and Plan:  Viral URI 20 year old female presents to the emergency department with chills and body aches that started yesterday.  Vital signs were reassuring at triage.  On physical exam, patient was alert, active and nontoxic-appearing.  COVID-19 and influenza testing are in process at this time.  Rest and fluids were encouraged at home.  Tylenol and ibuprofen alternating for fever were recommended.      ____________________________________________  FINAL CLINICAL IMPRESSION(S) / ED DIAGNOSES  Final diagnoses:  Viral syndrome      NEW MEDICATIONS STARTED DURING THIS VISIT:  ED Discharge Orders    None          This chart was dictated using voice recognition software/Dragon. Despite best efforts to proofread, errors can occur which can change the meaning. Any change was purely unintentional.     Orvil Feil, PA-C 12/10/20 1748    Phineas Semen, MD 12/10/20 [phone]

## 2020-12-10 NOTE — Discharge Instructions (Signed)
You can take Tylenol and ibuprofen alternating for fever and body aches. You can check your COVID-19 and influenza results to MyChart.

## 2020-12-10 NOTE — ED Triage Notes (Signed)
Pt c/o generalized body aches and chills that started yesterday. Denies known exposure to anyone who is sick.

## 2020-12-21 ENCOUNTER — Ambulatory Visit: Payer: Medicaid Other | Admitting: Internal Medicine

## 2021-03-21 IMAGING — CR DG CHEST 2V
1 series · 2 of 2 positions shown · non-contrast
Comparison: No prior.

CLINICAL DATA: Productive cough.  Fever.  Abnormal breath sounds.

EXAM:
CHEST - 2 VIEW

[Series 1: dg chest 2 view · 0.14mm/px · 2 of 2 slices shown]
[im 1/2]
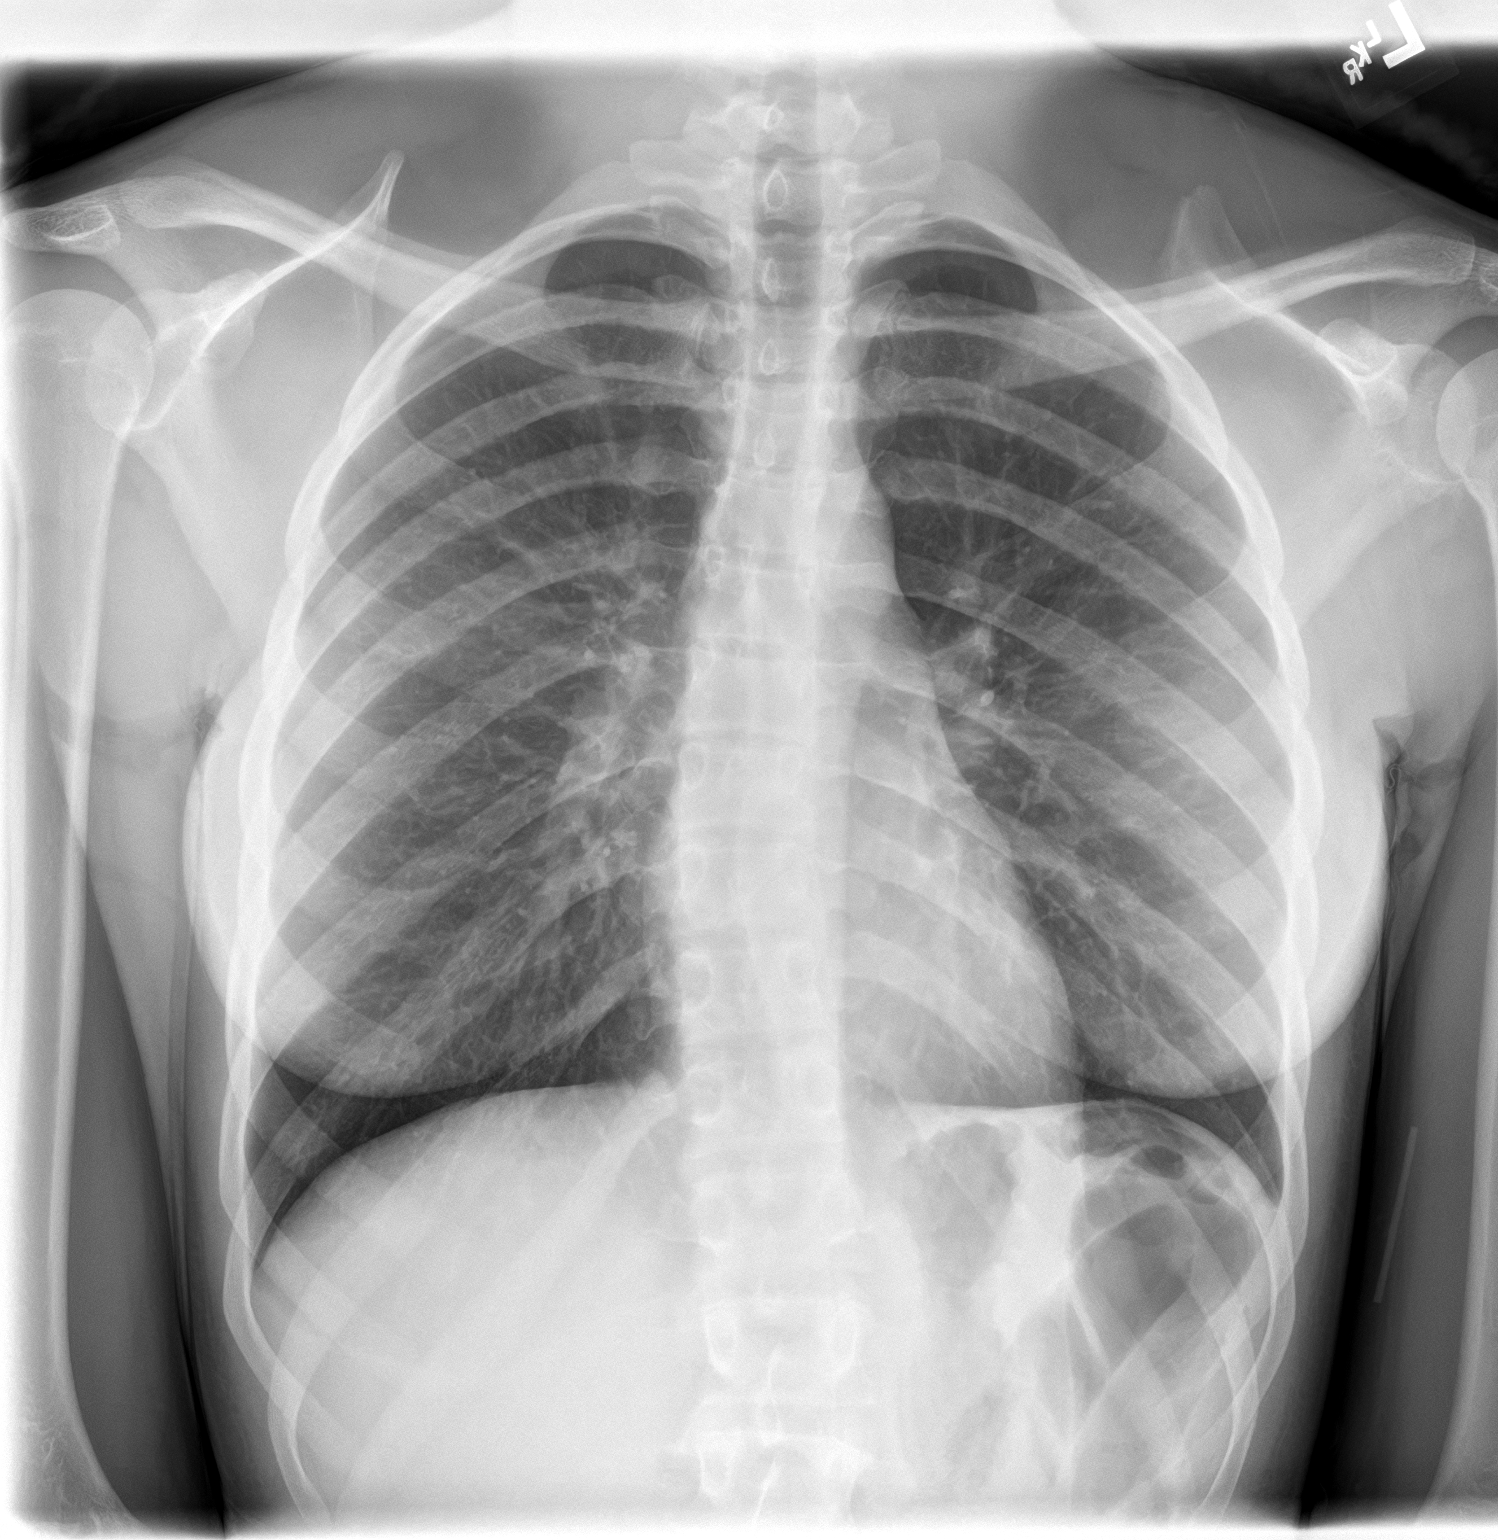
[im 2/2]
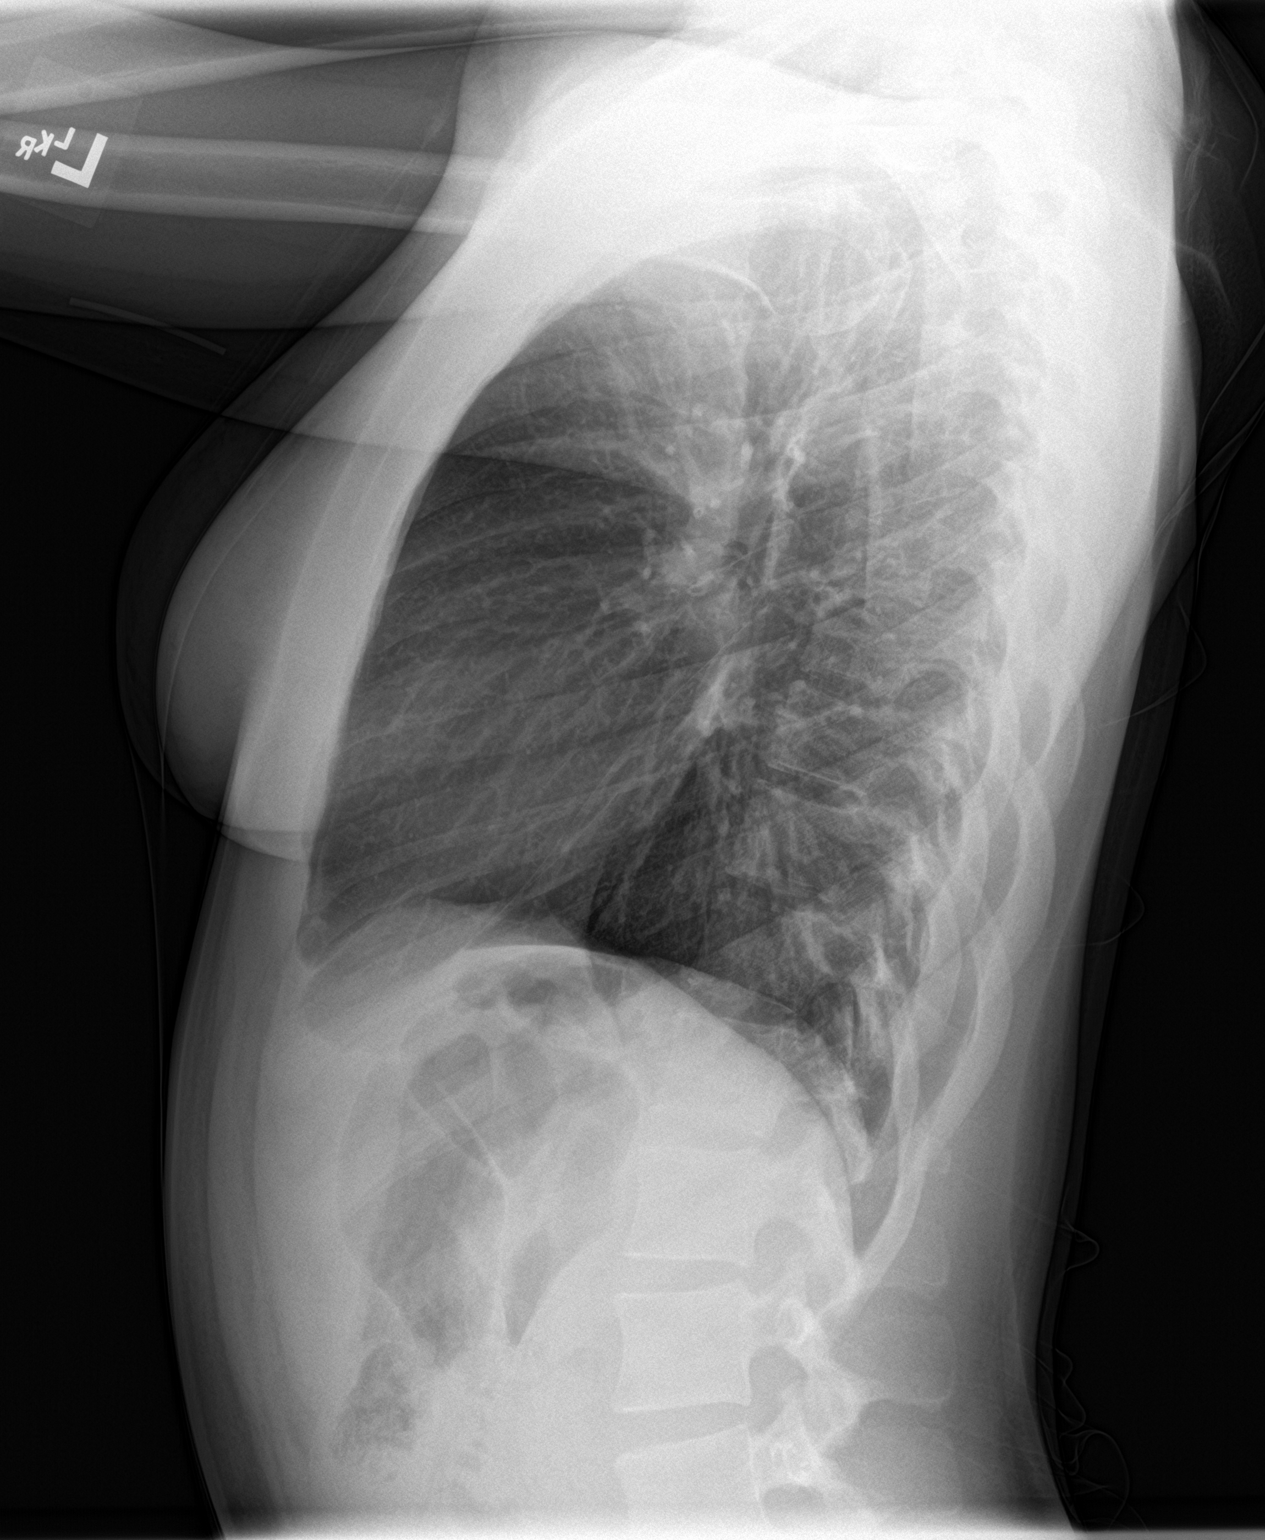

[2 of 2 positions shown; findings below may reference images not displayed]

FINDINGS: Mediastinum hilar structures normal. Very mild peribronchial cuffing
noted. Bronchitis cannot be excluded. No focal alveolar infiltrate.
No pleural effusion or pneumothorax. S shaped thoracic spine
scoliosis. No acute bony abnormality.
IMPRESSION: Very mild peribronchial cuffing noted. Bronchitis cannot be
excluded. No focal alveolar infiltrate identified.

## 2021-05-14 ENCOUNTER — Other Ambulatory Visit: Payer: Self-pay

## 2021-05-14 ENCOUNTER — Emergency Department: Payer: Medicaid Other

## 2021-05-14 ENCOUNTER — Emergency Department
Admission: EM | Admit: 2021-05-14 | Discharge: 2021-05-14 | Disposition: A | Discharge status: Home / Self Care | Payer: Medicaid Other | Attending: Emergency Medicine | Admitting: Emergency Medicine

## 2021-05-14 ENCOUNTER — Encounter: Payer: Self-pay | Admitting: Emergency Medicine

## 2021-05-14 DIAGNOSIS — J069 Acute upper respiratory infection, unspecified: Secondary | ICD-10-CM | POA: Insufficient documentation

## 2021-05-14 DIAGNOSIS — J45909 Unspecified asthma, uncomplicated: Secondary | ICD-10-CM

## 2021-05-14 DIAGNOSIS — J45901 Unspecified asthma with (acute) exacerbation: Secondary | ICD-10-CM | POA: Insufficient documentation

## 2021-05-14 DIAGNOSIS — R0600 Dyspnea, unspecified: Secondary | ICD-10-CM | POA: Insufficient documentation

## 2021-05-14 DIAGNOSIS — Z20822 Contact with and (suspected) exposure to covid-19: Secondary | ICD-10-CM | POA: Insufficient documentation

## 2021-05-14 DIAGNOSIS — R059 Cough, unspecified: Secondary | ICD-10-CM | POA: Diagnosis present

## 2021-05-14 LAB — BASIC METABOLIC PANEL
Anion gap: 6 (ref 5–15)
BUN: 13 mg/dL (ref 6–20)
CO2: 24 mmol/L (ref 22–32)
Calcium: 8.8 mg/dL — ABNORMAL LOW (ref 8.9–10.3)
Chloride: 110 mmol/L (ref 98–111)
Creatinine, Ser: 0.69 mg/dL (ref 0.44–1.00)
GFR, Estimated: >60 mL/min (ref >60)
Glucose, Bld: 102 mg/dL — ABNORMAL HIGH (ref 70–99)
Potassium: 3.5 mmol/L (ref 3.5–5.1)
Sodium: 140 mmol/L (ref 135–145)

## 2021-05-14 LAB — CBC
HCT: 35.6 % — ABNORMAL LOW (ref 36.0–46.0)
Hemoglobin: 12.2 g/dL (ref 12.0–15.0)
MCH: 32.6 pg (ref 26.0–34.0)
MCHC: 34.3 g/dL (ref 30.0–36.0)
MCV: 95.2 fL (ref 80.0–100.0)
Platelets: 217 10*3/uL (ref 150–400)
RBC: 3.74 MIL/uL — ABNORMAL LOW (ref 3.87–5.11)
RDW: 12.9 % (ref 11.5–15.5)
WBC: 6.6 10*3/uL (ref 4.0–10.5)
nRBC: 0 % (ref 0.0–0.2)

## 2021-05-14 LAB — RESP PANEL BY RT-PCR (FLU A&B, COVID) ARPGX2
Influenza A by PCR: NEGATIVE
Influenza B by PCR: NEGATIVE
SARS Coronavirus 2 by RT PCR: NEGATIVE

## 2021-05-14 LAB — TROPONIN I (HIGH SENSITIVITY): Troponin I (High Sensitivity): <2 ng/L (ref <18)

## 2021-05-14 LAB — POC URINE PREG, ED: Preg Test, Ur: NEGATIVE

## 2021-05-14 MED ORDER — ONDANSETRON 4 MG PO TBDP
4.0000 mg | ORAL_TABLET | Freq: Once | ORAL | Status: AC
  Administered 2021-05-14: 4 mg via ORAL
  Filled 2021-05-14: qty 1

## 2021-05-14 MED ORDER — ALBUTEROL SULFATE HFA 108 (90 BASE) MCG/ACT IN AERS: 2.0000 | INHALATION_SPRAY | Freq: Four times a day (QID) | RESPIRATORY_TRACT | 2 refills | Status: AC | PRN

## 2021-05-14 MED ORDER — DEXAMETHASONE SODIUM PHOSPHATE 10 MG/ML IJ SOLN
10.0000 mg | Freq: Once | INTRAMUSCULAR | Status: AC
  Administered 2021-05-14: 10 mg via INTRAMUSCULAR
  Filled 2021-05-14: qty 1

## 2021-05-14 MED ORDER — PREDNISONE 20 MG PO TABS
60.0000 mg | ORAL_TABLET | Freq: Once | ORAL | Status: AC
  Administered 2021-05-14: 60 mg via ORAL
  Filled 2021-05-14: qty 3

## 2021-05-14 MED ORDER — IPRATROPIUM-ALBUTEROL 0.5-2.5 (3) MG/3ML IN SOLN
3.0000 mL | Freq: Once | RESPIRATORY_TRACT | Status: AC
  Administered 2021-05-14: 3 mL via RESPIRATORY_TRACT
  Filled 2021-05-14: qty 3

## 2021-05-14 MED ORDER — AZITHROMYCIN 250 MG PO TABS: ORAL_TABLET | ORAL | 0 refills | Status: AC

## 2021-05-14 NOTE — ED Triage Notes (Signed)
Pt to ED from home c/o mid chest pain like pressure since 1500 today after work, productive yellow cough, SOB, denies n/v/d or fevers.  Pt A&Ox4, chest rise even and unlabored, skin WNL and in NAD at this time.

## 2021-05-14 NOTE — ED Provider Notes (Signed)
[facility] Emergency Department Provider Note  Time seen: 7:44 AM  I have reviewed the triage vital signs and the nursing notes.   HISTORY  Chief Complaint Chest Pain   HPI Judith Henson is a 20 y.o. female with a past medical history of asthma, depression, presents to the emergency department for cough congestion and difficulty breathing.  According to the patient since getting off work yesterday she states she has had slight sore throat has been coughing with mucus production and feels short of breath.  Patient states a history of asthma as a child but is not had any issues as an adult.  Denies any known fever.  Patient states slight discomfort in the center of her chest when coughing.   Past Medical History:  Diagnosis Date   Asthma    Depression     Patient Active Problem List   Diagnosis Date Noted   MDD (major depressive disorder) 06/09/2015    Past Surgical History:  Procedure Laterality Date   left wrist surgery      Prior to Admission medications   Medication Sig Start Date End Date Taking? Authorizing Provider  albuterol (VENTOLIN HFA) 108 (90 Base) MCG/ACT inhaler Inhale 2 puffs into the lungs every 6 (six) hours as needed for wheezing or shortness of breath. 10/12/20   Sherrie Mustache Roselyn Bering, PA-C  azithromycin (ZITHROMAX Z-PAK) 250 MG tablet 2 pills today then 1 pill a day for 4 days 10/12/20   Faythe Ghee, PA-C  Multiple Vitamins-Minerals (MULTIVITAMIN) tablet Take 1 tablet by mouth daily. 10/08/19   Federico Flake, MD    No Known Allergies  History reviewed. No pertinent family history.  Social History Social History   Tobacco Use   Smoking status: Never   Smokeless tobacco: Never  Vaping Use   Vaping Use: Never used  Substance Use Topics   Alcohol use: No   Drug use: No    Review of Systems Constitutional: Negative for fever Cardiovascular: Slight central chest pain when coughing. Respiratory: Positive for shortness  of breath.  Positive for productive cough. Gastrointestinal: Negative for abdominal pain, vomiting  Musculoskeletal: Negative for musculoskeletal complaints Neurological: Negative for headache All other ROS negative  ____________________________________________   PHYSICAL EXAM:  VITAL SIGNS: ED Triage Vitals  Enc Vitals Group     BP 05/14/21 0254 114/66     Pulse Rate 05/14/21 0254 95     Resp 05/14/21 0254 16     Temp 05/14/21 0254 98 F (36.7 C)     Temp Source 05/14/21 0254 Oral     SpO2 05/14/21 0254 94 %     Weight 05/14/21 0250 120 lb (54.4 kg)     Height 05/14/21 0250 5\' 5"  (1.651 m)     Head Circumference --      Peak Flow --      Pain Score 05/14/21 0250 8     Pain Loc --      Pain Edu? --      Excl. in GC? --    Constitutional: Alert and oriented. Well appearing and in no distress. Eyes: Normal exam ENT      Head: Normocephalic and atraumatic.      Mouth/Throat: Mucous membranes are moist. Cardiovascular: Normal rate, regular rhythm.  Respiratory: Normal respiratory effort without tachypnea nor retractions.  Mild to moderate expiratory wheeze on examination. Gastrointestinal: Soft and nontender. No distention.  Musculoskeletal: Nontender with normal range of motion in all extremities.  Neurologic:  Normal speech  and language. No gross focal neurologic deficits Skin:  Skin is warm, dry and intact.  Psychiatric: Mood and affect are normal.   ____________________________________________    EKG  EKG viewed and interpreted by myself shows a normal sinus rhythm at 95 bpm with a narrow QRS, normal axis, normal intervals, no concerning ST changes.  ____________________________________________    RADIOLOGY  Chest x-ray is clear  ____________________________________________   INITIAL IMPRESSION / ASSESSMENT AND PLAN / ED COURSE  Pertinent labs & imaging results that were available during my care of the patient were reviewed by me and considered in my  medical decision making (see chart for details).   Patient presents to the emergency department for cough congestion shortness of breath.  On examination patient has moderate expiratory wheeze bilaterally but no respiratory distress normal respiratory rate.  Currently satting 97%.  Given the patient's expiratory wheeze highly suspect upper respiratory infection leading to asthma exacerbation.  We will treat with duo nebs, prednisone.  We will also check a COVID swab as a precaution and continue to closely monitor.  Patient agreeable to plan of care.  Patient vomited shortly after getting the prednisone.  Patient given Zofran and IM Decadron.  Patient states she is feeling much better.  Repeat lung exam shows very slight expiratory wheeze much improved from earlier continues to have a cough.  Will cover with Zithromax as a precaution for possible bronchitis, patient received IM Decadron will not need further steroids but we will prescribe albuterol to be used as needed.  Discussed return precautions.  Patient agreeable to plan of care.  Judith Henson was evaluated in Emergency Department on 05/14/2021 for the symptoms described in the history of present illness. She was evaluated in the context of the global COVID-19 pandemic, which necessitated consideration that the patient might be at risk for infection with the SARS-CoV-2 virus that causes COVID-19. Institutional protocols and algorithms that pertain to the evaluation of patients at risk for COVID-19 are in a state of rapid change based on information released by regulatory bodies including the CDC and federal and state organizations. These policies and algorithms were followed during the patient's care in the ED.  ____________________________________________   FINAL CLINICAL IMPRESSION(S) / ED DIAGNOSES  Dyspnea Asthma exacerbation Upper respiratory infection   Minna Antis, MD 05/14/21 [phone]

## 2021-05-14 NOTE — ED Notes (Signed)
Pt noted to be in sinus tach, hr 136. Pt states that she recently vomited and the prednisone pills came up. No increase or changes in pain or respiratory status. Edp aware.

## 2021-08-25 ENCOUNTER — Other Ambulatory Visit: Payer: Self-pay

## 2021-08-25 ENCOUNTER — Emergency Department
Admission: EM | Admit: 2021-08-25 | Discharge: 2021-08-25 | Disposition: A | Discharge status: Home / Self Care | Payer: Medicaid Other | Attending: Emergency Medicine | Admitting: Emergency Medicine

## 2021-08-25 DIAGNOSIS — R1030 Lower abdominal pain, unspecified: Secondary | ICD-10-CM | POA: Diagnosis present

## 2021-08-25 DIAGNOSIS — Z20822 Contact with and (suspected) exposure to covid-19: Secondary | ICD-10-CM | POA: Insufficient documentation

## 2021-08-25 DIAGNOSIS — R112 Nausea with vomiting, unspecified: Secondary | ICD-10-CM | POA: Diagnosis not present

## 2021-08-25 LAB — COMPREHENSIVE METABOLIC PANEL
ALT: 16 U/L (ref 0–44)
AST: 21 U/L (ref 15–41)
Albumin: 4.1 g/dL (ref 3.5–5.0)
Alkaline Phosphatase: 53 U/L (ref 38–126)
Anion gap: 5 (ref 5–15)
BUN: 12 mg/dL (ref 6–20)
CO2: 25 mmol/L (ref 22–32)
Calcium: 8.8 mg/dL — ABNORMAL LOW (ref 8.9–10.3)
Chloride: 105 mmol/L (ref 98–111)
Creatinine, Ser: 0.73 mg/dL (ref 0.44–1.00)
GFR, Estimated: >60 mL/min (ref >60)
Glucose, Bld: 87 mg/dL (ref 70–99)
Potassium: 3.7 mmol/L (ref 3.5–5.1)
Sodium: 135 mmol/L (ref 135–145)
Total Bilirubin: 1.5 mg/dL — ABNORMAL HIGH (ref 0.3–1.2)
Total Protein: 7.7 g/dL (ref 6.5–8.1)

## 2021-08-25 LAB — CBC
HCT: 40.6 % (ref 36.0–46.0)
Hemoglobin: 13.4 g/dL (ref 12.0–15.0)
MCH: 31.6 pg (ref 26.0–34.0)
MCHC: 33 g/dL (ref 30.0–36.0)
MCV: 95.8 fL (ref 80.0–100.0)
Platelets: 190 10*3/uL (ref 150–400)
RBC: 4.24 MIL/uL (ref 3.87–5.11)
RDW: 12.7 % (ref 11.5–15.5)
WBC: 4.8 10*3/uL (ref 4.0–10.5)
nRBC: 0 % (ref 0.0–0.2)

## 2021-08-25 LAB — URINALYSIS, ROUTINE W REFLEX MICROSCOPIC
Bilirubin Urine: NEGATIVE
Glucose, UA: NEGATIVE mg/dL
Hgb urine dipstick: NEGATIVE
Ketones, ur: NEGATIVE mg/dL
Leukocytes,Ua: NEGATIVE
Nitrite: NEGATIVE
Protein, ur: NEGATIVE mg/dL
Specific Gravity, Urine: 1.025 (ref 1.005–1.030)
pH: 5 (ref 5.0–8.0)

## 2021-08-25 LAB — LIPASE, BLOOD: Lipase: 25 U/L (ref 11–51)

## 2021-08-25 LAB — RESP PANEL BY RT-PCR (FLU A&B, COVID) ARPGX2
Influenza A by PCR: NEGATIVE
Influenza B by PCR: NEGATIVE
SARS Coronavirus 2 by RT PCR: NEGATIVE

## 2021-08-25 LAB — POC URINE PREG, ED: Preg Test, Ur: NEGATIVE

## 2021-08-25 MED ORDER — ONDANSETRON HCL 4 MG/2ML IJ SOLN
4.0000 mg | Freq: Once | INTRAMUSCULAR | Status: AC
  Administered 2021-08-25: 4 mg via INTRAVENOUS
  Filled 2021-08-25: qty 2

## 2021-08-25 MED ORDER — ONDANSETRON 4 MG PO TBDP: 4.0000 mg | ORAL_TABLET | Freq: Three times a day (TID) | ORAL | 0 refills | Status: DC | PRN

## 2021-08-25 MED ORDER — SODIUM CHLORIDE 0.9 % IV BOLUS
1000.0000 mL | Freq: Once | INTRAVENOUS | Status: AC
  Administered 2021-08-25: 1000 mL via INTRAVENOUS

## 2021-08-25 NOTE — ED Notes (Signed)
Pt provided discharge instructions and prescription information. Pt was given the opportunity to ask questions and questions were answered. Discharge signature not obtained in the setting of the COVID-19 pandemic in order to reduce high touch surfaces.  ° °

## 2021-08-25 NOTE — ED Triage Notes (Signed)
Pt c/o generalized abd pain with N/V that started today pt is in NAD

## 2021-08-25 NOTE — ED Provider Notes (Signed)
[facility] Provider Note    Event Date/Time   First MD Initiated Contact with Patient 08/25/21 1534     (approximate)   History   Abdominal Pain   HPI  Judith Henson is a 21 y.o. female  here with abdominal pain, nausea, vomiting. Pt states that she woke up this morning and had some mild nausea/decreased appetite so she did not eat anything before coming to work.  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  The emesis was nonbloody and nonbilious.  She has had some mild lower abdominal cramping with this but this is not constant or persistent.  She denies any significant pain prior to this.  No known fevers.  No known sick contacts but she does work in the hospital.  No urinary symptoms.  She has been able to drink a little bit of fluid but has not eaten any solid food today.  She states she did eat [employer] last night which could have caused some of her discomfort.      Physical Exam   Triage Vital Signs: ED Triage Vitals [08/25/21 1424]  Enc Vitals Group     BP 119/74     Pulse Rate 71     Resp 17     Temp 98.2 F (36.8 C)     Temp Source Oral     SpO2 98 %     Weight      Height      Head Circumference      Peak Flow      Pain Score      Pain Loc      Pain Edu?      Excl. in GC?     Most recent vital signs: Vitals:   08/25/21 1424  BP: 119/74  Pulse: 71  Resp: 17  Temp: 98.2 F (36.8 C)  SpO2: 98%     General: Awake, no distress.  CV:  Good peripheral perfusion.  Regular rate and rhythm.  No murmurs. Resp:  Normal effort.  Lungs clear to auscultation bilaterally. Abd:  No distention.  No tenderness, specifically no right upper quadrant or right lower quadrant tenderness.  No rebound or guarding. Other:  Moist mucous membranes, normal cap refill.   ED Results / Procedures / Treatments   Labs (all labs ordered are listed, but only abnormal results are displayed) Labs Reviewed   COMPREHENSIVE METABOLIC PANEL - Abnormal; Notable for the following components:      Result Value   Calcium 8.8 (*)    Total Bilirubin 1.5 (*)    All other components within normal limits  RESP PANEL BY RT-PCR (FLU A&B, COVID) ARPGX2  LIPASE, BLOOD  CBC  URINALYSIS, ROUTINE W REFLEX MICROSCOPIC  POC URINE PREG, ED     EKG    RADIOLOGY   PROCEDURES:  Critical Care performed: No    MEDICATIONS ORDERED IN ED: Medications  sodium chloride 0.9 % bolus 1,000 mL (1,000 mLs Intravenous New Bag/Given 08/25/21 1601)  ondansetron (ZOFRAN) injection 4 mg (4 mg Intravenous Given 08/25/21 1602)     IMPRESSION / MDM / ASSESSMENT AND PLAN / ED COURSE  I reviewed the triage vital signs and the nursing notes.                                Ddx:  Viral GI illness, gastritis, foodborne illness, enteritis, unlikely  appendicitis or cholecystitis.   MDM:  21 year old very well-appearing female here with nausea and vomiting with minimal abdominal pain after emesis, though soft abdomen on exam.  CBC shows no leukocytosis or anemia.  CMP is unremarkable with normal LFTs, renal function, and electrolytes.  Lipase is normal.  Urinalysis with no signs of UTI.  Urine pregnancy is negative.  Serial abdominal exams are benign.  Reviewed previous records which showed no significant underlying medical conditions, she has been seen multiple times in the ED and at urgent care for UTIs.  Denies any symptoms here and urinalysis was unremarkable.  Patient given fluids with good improvement and is now tolerating p.o.  She is otherwise healthy with no significant risk factors for obstruction or complicated course.  We will treat with symptomatic care and good return precautions.   MEDICATIONS GIVEN IN ED: Medications  sodium chloride 0.9 % bolus 1,000 mL (1,000 mLs Intravenous New Bag/Given 08/25/21 1601)  ondansetron (ZOFRAN) injection 4 mg (4 mg Intravenous Given 08/25/21 1602)     Consults:   None   EMR reviewed  Previous ED and urgent care visits, including visit with Ali Lowe on 8/21, with frequent viral illnesses and UTIs     FINAL CLINICAL IMPRESSION(S) / ED DIAGNOSES   Final diagnoses:  Nausea and vomiting, unspecified vomiting type     Rx / DC Orders   ED Discharge Orders          Ordered    ondansetron (ZOFRAN-ODT) 4 MG disintegrating tablet  Every 8 hours PRN        08/25/21 1609             Note:  This document was prepared using Dragon voice recognition software and may include unintentional dictation errors.   Shaune Pollack, MD 08/25/21 [phone]

## 2021-08-25 NOTE — ED Notes (Signed)
Pt provided with gingerale for PO challenge  

## 2021-10-10 ENCOUNTER — Ambulatory Visit
Admission: RE | Admit: 2021-10-10 | Discharge: 2021-10-10 | Disposition: A | Discharge status: Home / Self Care | Payer: Medicaid Other | Source: Ambulatory Visit | Attending: Emergency Medicine | Admitting: Emergency Medicine

## 2021-10-10 ENCOUNTER — Other Ambulatory Visit: Payer: Self-pay

## 2021-10-10 VITALS — BP 116/68 | HR 76 | Temp 98.5°F | Resp 18

## 2021-10-10 DIAGNOSIS — J029 Acute pharyngitis, unspecified: Secondary | ICD-10-CM

## 2021-10-10 LAB — POCT RAPID STREP A (OFFICE): Rapid Strep A Screen: NEGATIVE

## 2021-10-10 MED ORDER — IBUPROFEN 600 MG PO TABS: 600.0000 mg | ORAL_TABLET | Freq: Four times a day (QID) | ORAL | 0 refills | Status: DC | PRN

## 2021-10-10 MED ORDER — LIDOCAINE VISCOUS HCL 2 % MT SOLN: 15.0000 mL | OROMUCOSAL | 0 refills | Status: DC | PRN

## 2021-10-10 NOTE — ED Provider Notes (Signed)
?[facility] ? ? ? ?CSN: [id number] ?Arrival date & time: 10/10/21  1449 ? ? ?  ? ?History   ?Chief Complaint ?Chief Complaint  ?Patient presents with  ? Sore Throat  ? ? ?HPI ?Judith Henson is a 21 y.o. female.  Patient presents with sore throat since yesterday.  She denies difficulty swallowing, fever, rash, cough, shortness of breath, or other symptoms.  No treatments at home.  Her medical history includes asthma and depression. ? ?The history is provided by the patient.  ? ?Past Medical History:  ?Diagnosis Date  ? Asthma   ? Depression   ? ? ?Patient Active Problem List  ? Diagnosis Date Noted  ? MDD (major depressive disorder) 06/09/2015  ? ? ?Past Surgical History:  ?Procedure Laterality Date  ? left wrist surgery    ? ? ?OB History   ? ? Gravida  ?0  ? Para  ?0  ? Term  ?0  ? Preterm  ?0  ? AB  ?0  ? Living  ?0  ?  ? ? SAB  ?0  ? IAB  ?0  ? Ectopic  ?0  ? Multiple  ?0  ? Live Births  ?   ?   ?  ?  ? ? ? ?Home Medications   ? ?Prior to Admission medications   ?Medication Sig Start Date End Date Taking? Authorizing Provider  ?ibuprofen (ADVIL) 600 MG tablet Take 1 tablet (600 mg total) by mouth every 6 (six) hours as needed. 10/10/21  Yes Mickie Bail, NP  ?lidocaine (XYLOCAINE) 2 % solution Use as directed 15 mLs in the mouth or throat as needed for mouth pain. 10/10/21  Yes Mickie Bail, NP  ?albuterol (VENTOLIN HFA) 108 (90 Base) MCG/ACT inhaler Inhale 2 puffs into the lungs every 6 (six) hours as needed for wheezing or shortness of breath. 05/14/21   Minna Antis, MD  ?Multiple Vitamins-Minerals (MULTIVITAMIN) tablet Take 1 tablet by mouth daily. 10/08/19   Federico Flake, MD  ?ondansetron (ZOFRAN-ODT) 4 MG disintegrating tablet Take 1 tablet (4 mg total) by mouth every 8 (eight) hours as needed for nausea or vomiting. 08/25/21   Shaune Pollack, MD  ? ? ?Family History ?History reviewed. No pertinent family history. ? ?Social History ?Social History  ? ?Tobacco Use  ? Smoking status:  Never  ? Smokeless tobacco: Never  ?Vaping Use  ? Vaping Use: Never used  ?Substance Use Topics  ? Alcohol use: No  ? Drug use: No  ? ? ? ?Allergies   ?Patient has no known allergies. ? ? ?Review of Systems ?Review of Systems  ?Constitutional:  Negative for chills and fever.  ?HENT:  Positive for sore throat. Negative for ear pain.   ?Respiratory:  Negative for cough, shortness of breath and wheezing.   ?Cardiovascular:  Negative for chest pain and palpitations.  ?Gastrointestinal:  Negative for diarrhea and vomiting.  ?Skin:  Negative for color change and rash.  ?All other systems reviewed and are negative. ? ? ?Physical Exam ?Triage Vital Signs ?ED Triage Vitals  ?Enc Vitals Group  ?   BP 10/10/21 1458 116/68  ?   Pulse Rate 10/10/21 1458 76  ?   Resp 10/10/21 1458 18  ?   Temp 10/10/21 1458 98.5 ?F (36.9 ?C)  ?   Temp src --   ?   SpO2 10/10/21 1458 96 %  ?   Weight --   ?   Height --   ?  Head Circumference --   ?   Peak Flow --   ?   Pain Score 10/10/21 1459 8  ?   Pain Loc --   ?   Pain Edu? --   ?   Excl. in GC? --   ? ?No data found. ? ?Updated Vital Signs ?BP 116/68   Pulse 76   Temp 98.5 ?F (36.9 ?C)   Resp 18   LMP 10/06/2021   SpO2 96%  ? ?Visual Acuity ?Right Eye Distance:   ?Left Eye Distance:   ?Bilateral Distance:   ? ?Right Eye Near:   ?Left Eye Near:    ?Bilateral Near:    ? ?Physical Exam ?Vitals and nursing note reviewed.  ?Constitutional:   ?   General: She is not in acute distress. ?   Appearance: She is well-developed. She is not ill-appearing.  ?HENT:  ?   Right Ear: Tympanic membrane normal.  ?   Left Ear: Tympanic membrane normal.  ?   Nose: Nose normal.  ?   Mouth/Throat:  ?   Mouth: Mucous membranes are moist.  ?   Pharynx: Posterior oropharyngeal erythema present.  ?Cardiovascular:  ?   Rate and Rhythm: Normal rate and regular rhythm.  ?   Heart sounds: Normal heart sounds.  ?Pulmonary:  ?   Effort: Pulmonary effort is normal. No respiratory distress.  ?   Breath sounds: Normal  breath sounds. No wheezing.  ?Musculoskeletal:  ?   Cervical back: Neck supple.  ?Skin: ?   General: Skin is warm and dry.  ?Neurological:  ?   Mental Status: She is alert.  ?Psychiatric:     ?   Mood and Affect: Mood normal.     ?   Behavior: Behavior normal.  ? ? ? ?UC Treatments / Results  ?Labs ?(all labs ordered are listed, but only abnormal results are displayed) ?Labs Reviewed  ?POCT RAPID STREP A (OFFICE)  ? ? ?EKG ? ? ?Radiology ?No results found. ? ?Procedures ?Procedures (including critical care time) ? ?Medications Ordered in UC ?Medications - No data to display ? ?Initial Impression / Assessment and Plan / UC Course  ?I have reviewed the triage vital signs and the nursing notes. ? ?Pertinent labs & imaging results that were available during my care of the patient were reviewed by me and considered in my medical decision making (see chart for details). ? ?Sore throat.  Rapid strep negative.  Treating with ibuprofen and viscous lidocaine as needed.  Work note provided per patient request.  Instructed patient to follow up with her PCP if her symptoms are not improving.  She agrees to plan of care.  ? ? ? ?Final Clinical Impressions(s) / UC Diagnoses  ? ?Final diagnoses:  ?Sore throat  ? ? ? ?Discharge Instructions   ? ?  ?Your strep test is negative.  Take the ibuprofen and use the viscous lidocaine as directed.  Follow up with your primary care provider if your symptoms are not improving.   ? ? ? ? ? ?ED Prescriptions   ? ? Medication Sig Dispense Auth. Provider  ? ibuprofen (ADVIL) 600 MG tablet Take 1 tablet (600 mg total) by mouth every 6 (six) hours as needed. 30 tablet Mickie Bail, NP  ? lidocaine (XYLOCAINE) 2 % solution Use as directed 15 mLs in the mouth or throat as needed for mouth pain. 100 mL Mickie Bail, NP  ? ?  ? ?PDMP not reviewed this encounter. ?  ?  Mickie Bailate, Brihanna Devenport H, NP ?10/10/21 1529 ? ?

## 2021-10-10 NOTE — ED Triage Notes (Signed)
Pt presents with ST since yesterday. ?

## 2021-10-10 NOTE — Discharge Instructions (Addendum)
Your strep test is negative.  Take the ibuprofen and use the viscous lidocaine as directed.  Follow up with your primary care provider if your symptoms are not improving.    

## 2022-03-26 ENCOUNTER — Other Ambulatory Visit: Payer: Self-pay

## 2022-03-26 DIAGNOSIS — Z20822 Contact with and (suspected) exposure to covid-19: Secondary | ICD-10-CM | POA: Diagnosis not present

## 2022-03-26 DIAGNOSIS — Z3A01 Less than 8 weeks gestation of pregnancy: Secondary | ICD-10-CM | POA: Insufficient documentation

## 2022-03-26 DIAGNOSIS — O99511 Diseases of the respiratory system complicating pregnancy, first trimester: Secondary | ICD-10-CM | POA: Insufficient documentation

## 2022-03-26 DIAGNOSIS — J069 Acute upper respiratory infection, unspecified: Secondary | ICD-10-CM | POA: Diagnosis not present

## 2022-03-26 DIAGNOSIS — O368910 Maternal care for other specified fetal problems, first trimester, not applicable or unspecified: Secondary | ICD-10-CM | POA: Insufficient documentation

## 2022-03-26 DIAGNOSIS — O26891 Other specified pregnancy related conditions, first trimester: Secondary | ICD-10-CM | POA: Diagnosis present

## 2022-03-26 DIAGNOSIS — B9789 Other viral agents as the cause of diseases classified elsewhere: Secondary | ICD-10-CM | POA: Diagnosis not present

## 2022-03-26 NOTE — ED Triage Notes (Signed)
Pt presents to ER with c/o cough, chills, body aches and congestion that started this AM.  Pt denies any known sick contacts.  Pt also endorses being between 3-[redacted] weeks pregnant.  Pt denies vaginal bleeding or abd pain at this time.  States she would like a blood HCG test.  Pt otherwise A&O x4 at this time in NAD in triage.

## 2022-03-27 ENCOUNTER — Emergency Department
Admission: EM | Admit: 2022-03-27 | Discharge: 2022-03-27 | Disposition: A | Discharge status: Home / Self Care | Payer: Medicaid Other | Attending: Emergency Medicine | Admitting: Emergency Medicine

## 2022-03-27 ENCOUNTER — Emergency Department: Payer: Medicaid Other

## 2022-03-27 ENCOUNTER — Encounter: Payer: Self-pay | Admitting: Radiology

## 2022-03-27 DIAGNOSIS — Z3A01 Less than 8 weeks gestation of pregnancy: Secondary | ICD-10-CM

## 2022-03-27 DIAGNOSIS — O418X10 Other specified disorders of amniotic fluid and membranes, first trimester, not applicable or unspecified: Secondary | ICD-10-CM

## 2022-03-27 DIAGNOSIS — J069 Acute upper respiratory infection, unspecified: Secondary | ICD-10-CM

## 2022-03-27 LAB — HCG, QUANTITATIVE, PREGNANCY: hCG, Beta Chain, Quant, S: >250000 m[IU]/mL — ABNORMAL HIGH (ref <5)

## 2022-03-27 LAB — SARS CORONAVIRUS 2 BY RT PCR: SARS Coronavirus 2 by RT PCR: NEGATIVE

## 2022-03-27 LAB — POC URINE PREG, ED: Preg Test, Ur: POSITIVE — AB

## 2022-03-27 NOTE — Discharge Instructions (Addendum)
As we discussed, if you develop any vaginal bleeding or lower abdominal pain or cramping then please return to the ED.  Otherwise please follow-up with your OB/GYN on the normally scheduled visit next month.  During your visit today, the blood work so your pregnancy hormone, hCG, was very elevated.  The OB/GYN would likely want to repeat this test.  You have a small subchorionic hemorrhage, this is a small amount of blood between your uterus and the placenta.

## 2022-03-27 NOTE — ED Provider Notes (Addendum)
[facility] Provider Note    Event Date/Time   First MD Initiated Contact with Patient 03/27/22 0246     (approximate)   History   Cough and Generalized Body Aches   HPI  Judith Henson is a 21 y.o. female who presents to the ED for evaluation of Cough and Generalized Body Aches   Patient is a G1 reportedly at about [redacted] weeks gestation with an LMP of July 21.  She presents to the ED for evaluation of upper respiratory congestion, nonproductive cough and myalgias for about 24 hours.  Denies any abdominal pain, vaginal bleeding or spotting.  She called her OB/GYN clinic and has an appointment for early October.    Physical Exam   Triage Vital Signs: ED Triage Vitals [03/26/22 2325]  Enc Vitals Group     BP 127/76     Pulse Rate (!) 102     Resp 20     Temp 99.8 F (37.7 C)     Temp Source Oral     SpO2 95 %     Weight 144 lb (65.3 kg)     Height 5\' 5"  (1.651 m)     Head Circumference      Peak Flow      Pain Score 5     Pain Loc      Pain Edu?      Excl. in [place]?     Most recent vital signs: Vitals:   03/26/22 2325 03/27/22 0656  BP: 127/76 108/66  Pulse: (!) 102 96  Resp: 20 17  Temp: 99.8 F (37.7 C) 99.3 F (37.4 C)  SpO2: 95% 97%    General: Awake, no distress.  Looks well.  Mild upper respiratory congestion and occasional sniffling during my evaluation. CV:  Good peripheral perfusion.  Resp:  Normal effort.  Abd:  No distention.  MSK:  No deformity noted.  Neuro:  No focal deficits appreciated. Other:     ED Results / Procedures / Treatments   Labs (all labs ordered are listed, but only abnormal results are displayed) Labs Reviewed  HCG, QUANTITATIVE, PREGNANCY - Abnormal; Notable for the following components:      Result Value   hCG, Beta Chain, Quant, S >250,000 (*)    All other components within normal limits  POC URINE PREG, ED - Abnormal; Notable for the following components:   Preg Test, Ur POSITIVE (*)    All  other components within normal limits  SARS CORONAVIRUS 2 BY RT PCR    EKG   RADIOLOGY CXR interpreted by me without evidence of acute cardiopulmonary pathology.  Official radiology report(s): US OB LESS THAN 14 WEEKS WITH OB TRANSVAGINAL  Result Date: 03/27/2022 CLINICAL DATA:  21 year old female with elevated quantitative beta HCG in the 1st trimester of pregnancy (reportedly greater than 250,000). EXAM: OBSTETRIC <14 WK [place] AND TRANSVAGINAL OB US TECHNIQUE: Both transabdominal and transvaginal ultrasound examinations were performed for complete evaluation of the gestation as well as the maternal uterus, adnexal regions, and pelvic cul-de-sac. Transvaginal technique was performed to assess early pregnancy. COMPARISON:  None Available. FINDINGS: Intrauterine gestational sac: Single, somewhat elongated (series 1 transvaginal images image 18) but otherwise unremarkable. Yolk sac:  Visible Embryo:  Visible Cardiac Activity: Detected Heart Rate: 144 bpm CRL:  8.8 mm   6 w   6 d                  [place] EDC: 11/14/2022 Subchorionic hemorrhage:  Small  volume, series 1, image 9). Maternal uterus/adnexae: No pelvic free fluid. The right ovary appears normal measuring 3.1 x 2.2 x 1.8 cm. The left ovary measures 4.4 x 1.8 x 3.4 cm and is within normal limits. IMPRESSION: 1. Single living IUP demonstrated, with a somewhat elongated gestational sac but otherwise unremarkable. Estimated gestational age by crown rump length 6 weeks and 6 days. 2. Small volume subchorionic hemorrhage. No pelvic free fluid and both ovaries are within normal limits. Electronically Signed   By: Odessa Fleming M.D.   On: 03/27/2022 06:57   DG Chest 2 View  Result Date: 03/27/2022 CLINICAL DATA:  Fever and cough. EXAM: CHEST - 2 VIEW COMPARISON:  Chest radiograph dated 05/14/2021. FINDINGS: The heart size and mediastinal contours are within normal limits. Both lungs are clear. The visualized skeletal structures are unremarkable. IMPRESSION: No  active cardiopulmonary disease. Electronically Signed   By: Elgie Collard M.D.   On: 03/27/2022 03:53    PROCEDURES and INTERVENTIONS:  Procedures  Medications - No data to display   IMPRESSION / MDM / ASSESSMENT AND PLAN / ED COURSE  I reviewed the triage vital signs and the nursing notes.  Differential diagnosis includes, but is not limited to, bronchitis, viral syndrome, pneumonia, pneumothorax  {Patient presents with symptoms of an acute illness or injury that is potentially life-threatening.  G1 at about [redacted] weeks gestation by dates presents to the ED with evidence of a viral URI, found to have a remarkably elevated serum hCG level.  She denies any abdominal pain or cramping, vaginal bleeding or spotting.  Only has URI symptoms that I suspect are viral in etiology.  She is a clear CXR without signs of pneumonia.  Negative COVID test.  We send a serum hCG at her request prior to an upcoming outpatient OB/GYN visit, but it results surprisingly elevated.  Therefore obtain a transvaginal ultrasound to assess for pathology such as a molar pregnancy.  This is pending at the time of signout.  She continues to be asymptomatic from a intra-abdominal/pelvic/OB/GYN perspective.  Around the time of signout, the ultrasound results with a reassuring IUP at about [redacted] weeks gestation and a small subchorionic hemorrhage.  I educate the patient of this.  We discussed the discordance of her significantly elevated hCG, but what appears to be an essentially normal IUP alongside the subchorionic hemorrhage on the ultrasound.  We discussed following up with OB/GYN next month and her normally scheduled visit.  We discussed what to look out for at home as well as return precautions for the ED.  I answered her questions.  She is suitable for outpatient management.  Clinical Course as of 03/27/22 3710  [facility] Mar 27, 2022  6269 Reassessed and clarified LMP.  Reports LMP was July 21 and she is fairly confident about  this.  Continues to deny abdominal symptoms. [DS]  4854 Still awaiting ultrasound read at the time of signout.  She will be signed out to oncoming provider. [DS]    Clinical Course User Index [DS] Delton Prairie, MD     FINAL CLINICAL IMPRESSION(S) / ED DIAGNOSES   Final diagnoses:  Less than [redacted] weeks gestation of pregnancy  Viral upper respiratory tract infection  Subchorionic hematoma in first trimester, single or unspecified fetus     Rx / DC Orders   ED Discharge Orders     None        Note:  This document was prepared using Dragon voice recognition software and may include unintentional  dictation errors.   Delton Prairie, MD 03/27/22 5427    Delton Prairie, MD 03/27/22 [phone]

## 2022-04-26 DIAGNOSIS — Z3403 Encounter for supervision of normal first pregnancy, third trimester: Secondary | ICD-10-CM | POA: Insufficient documentation

## 2022-05-03 ENCOUNTER — Ambulatory Visit: Payer: Medicaid Other | Admitting: Nurse Practitioner

## 2022-05-26 DIAGNOSIS — Z8659 Personal history of other mental and behavioral disorders: Secondary | ICD-10-CM | POA: Insufficient documentation

## 2022-05-26 LAB — OB RESULTS CONSOLE HIV ANTIBODY (ROUTINE TESTING): HIV: NONREACTIVE

## 2022-05-26 LAB — OB RESULTS CONSOLE GC/CHLAMYDIA
Chlamydia: NEGATIVE
Neisseria Gonorrhea: NEGATIVE

## 2022-05-26 LAB — OB RESULTS CONSOLE VARICELLA ZOSTER ANTIBODY, IGG: Varicella: IMMUNE

## 2022-05-26 LAB — OB RESULTS CONSOLE RUBELLA ANTIBODY, IGM: Rubella: IMMUNE

## 2022-05-26 LAB — OB RESULTS CONSOLE HEPATITIS B SURFACE ANTIGEN: Hepatitis B Surface Ag: NEGATIVE

## 2022-05-26 LAB — HEPATITIS C ANTIBODY: HCV Ab: NEGATIVE

## 2022-07-24 NOTE — L&D Delivery Note (Signed)
Delivery Note At 4:46 PM a viable female was delivered via Vaginal, Spontaneous (Presentation: VTX/  LOA   ).  APGAR: 8, 9; weight 7 lb 0.5 oz (3190 g).   Placenta status: Spontaneous, Intact.  Cord: 3 vessels with the following complications: None.  Cord pH: 7.23 / Co2= 54/ bicar 22  Called into room by [facility] at 4:25 with fetal bradycardia . Cx when I wall called 9.5 cm . When I arrived cx was c/c+2 and she started to push . + meconium staining noted . Fetal heart rate fluctuated from 90-110 over the next 20 minutes . Ultimately she delivered fetal head . Mild shoulder dystocia noted and Mcrobert's and suprapubic pressure used to deliver the shoulders and body . Marland Kitchen Time on perineum 20 sec.  Infant placed on abdomen and nursery staff requested cord clamping at 15 sec. They did assign APGARS 8/9 . Cord Gas ; 7.23/ co2=52/ bicarb =22. Infant arms moving well Cord blood obtained  Placenta delivered 10 min PP . Active inferior periclitoral laceration noted and direct pressure was applied  Periclitoral and small hymenal laceration repaired with 00+000 vicryl suture  Good hemostasis noted . Crede maneuver performed at end .  Anesthesia: 1% lidocaine Fentanyl iv Episiotomy: None Lacerations: 1st degree Suture Repair: 2.0 3.0 vicryl Est. Blood Loss (mL):  300cc  Mom to postpartum.  Baby to Couplet care / Skin to Skin.  Judith Henson 11/06/2022, 5:22 PM

## 2022-08-23 LAB — OB RESULTS CONSOLE ABO/RH: RH Type: NEGATIVE

## 2022-08-23 LAB — OB RESULTS CONSOLE ANTIBODY SCREEN: Antibody Screen: NEGATIVE

## 2022-10-23 LAB — OB RESULTS CONSOLE HGB/HCT, BLOOD
HCT: 37 (ref 29–41)
Hemoglobin: 12.7

## 2022-11-05 ENCOUNTER — Other Ambulatory Visit: Payer: Self-pay

## 2022-11-05 ENCOUNTER — Observation Stay
Admission: EM | Admit: 2022-11-05 | Discharge: 2022-11-05 | Disposition: A | Discharge status: Home / Self Care | Payer: Medicaid Other | Source: Home / Self Care | Admitting: Obstetrics and Gynecology

## 2022-11-05 DIAGNOSIS — O99513 Diseases of the respiratory system complicating pregnancy, third trimester: Secondary | ICD-10-CM | POA: Insufficient documentation

## 2022-11-05 DIAGNOSIS — J45909 Unspecified asthma, uncomplicated: Secondary | ICD-10-CM | POA: Insufficient documentation

## 2022-11-05 DIAGNOSIS — Z3A38 38 weeks gestation of pregnancy: Secondary | ICD-10-CM | POA: Insufficient documentation

## 2022-11-05 DIAGNOSIS — Z79899 Other long term (current) drug therapy: Secondary | ICD-10-CM | POA: Insufficient documentation

## 2022-11-05 DIAGNOSIS — O471 False labor at or after 37 completed weeks of gestation: Secondary | ICD-10-CM | POA: Insufficient documentation

## 2022-11-05 DIAGNOSIS — Z87891 Personal history of nicotine dependence: Secondary | ICD-10-CM | POA: Insufficient documentation

## 2022-11-05 NOTE — Discharge Summary (Signed)
Judith Henson is a 22 y.o. female. She is at [redacted]w[redacted]d gestation. Patient's last menstrual period was 02/10/2022 (exact date). Estimated Date of Delivery: 11/17/22  Prenatal care site: [facility]   Current pregnancy complicated by:  - History of depression - Asthma  Chief complaint: uterine contractions  She reports regular uterine contractions and believes she may be in labor. Denies any leaking fluid. Reports good fetal movement.   S: Resting comfortably. Occasional CTX, no VB.no LOF,  Active fetal movement. Denies: HA, visual changes, SOB, or RUQ/epigastric pain  Maternal Medical History:   Past Medical History:  Diagnosis Date   Asthma    Depression     Past Surgical History:  Procedure Laterality Date   left wrist surgery      No Known Allergies  Prior to Admission medications   Medication Sig Start Date End Date Taking? Authorizing Provider  albuterol (VENTOLIN HFA) 108 (90 Base) MCG/ACT inhaler Inhale 2 puffs into the lungs every 6 (six) hours as needed for wheezing or shortness of breath. 05/14/21  Yes Minna Antis, MD  Prenatal MV & Min w/FA-DHA (PRENATAL GUMMIES PO) Take by mouth.   Yes [provider]  ibuprofen (ADVIL) 600 MG tablet Take 1 tablet (600 mg total) by mouth every 6 (six) hours as needed. Patient not taking: Reported on 11/05/2022 10/10/21   Mickie Bail, NP  lidocaine (XYLOCAINE) 2 % solution Use as directed 15 mLs in the mouth or throat as needed for mouth pain. Patient not taking: Reported on 11/05/2022 10/10/21   Mickie Bail, NP  Multiple Vitamins-Minerals (MULTIVITAMIN) tablet Take 1 tablet by mouth daily. Patient not taking: Reported on 11/05/2022 10/08/19   Federico Flake, MD  ondansetron (ZOFRAN-ODT) 4 MG disintegrating tablet Take 1 tablet (4 mg total) by mouth every 8 (eight) hours as needed for nausea or vomiting. Patient not taking: Reported on 11/05/2022 08/25/21   Shaune Pollack, MD    Social History: She   reports that she quit smoking about 2 months ago. Her smoking use included cigarettes. She has never used smokeless tobacco. She reports that she does not drink alcohol and does not use drugs.  Family History: family history is not on file.  no history of gyn cancers  Review of Systems: A full review of systems was performed and negative except as noted in the HPI.     O:  BP 126/73 (BP Location: Right Arm)   Pulse 85   Temp 98 F (36.7 C) (Oral)   LMP 02/10/2022 (Exact Date)  No results found for this or any previous visit (from the past 48 hour(s)).   Constitutional: NAD, AAOx3  HE/ENT: extraocular movements grossly intact, moist mucous membranes CV: RRR PULM: nl respiratory effort, CTABL     Abd: gravid, non-tender, non-distended, soft      Ext: Non-tender, Nonedematous   Psych: mood appropriate, speech normal Pelvic: 0.5/thick/-3  Pelvic exam: normal external genitalia, vulva, vagina, cervix, uterus and adnexa.  Fetal  monitoring: Cat 1 Appropriate for GA Baseline: 135bpm Variability: moderate Accelerations: present x >2 Decelerations absent Uterine contractions: contractions q3-72min, palpate mild  A/P: 22 y.o. [redacted]w[redacted]d here for antenatal surveillance for uterine contractions  Principle Diagnosis:  Early labor, uterine contractions  Labor: not active; possibly early labor. Reviewed labor precautions and when to return. Fetal Wellbeing: Reassuring Cat 1 tracing. Reactive NST  D/c home stable, precautions reviewed, follow-up as scheduled.    Janyce Llanos, CNM 11/05/2022 3:06 PM

## 2022-11-05 NOTE — Discharge Planning (Signed)
Discharge instructions provided to patient. Patient verbalized understanding. Pt educated on signs and symptoms of labor, vaginal bleeding, LOF, and fetal movement. Red flag signs reviewed by RN. Patient discharged home with significant other in stable condition.  

## 2022-11-05 NOTE — OB Triage Note (Signed)
Patient is a  22 yo, G1P0, at 38 weeks 2 days. Patient presents with complaints of abdominal pain.  Patient denies any vaginal bleeding or LOF. Patient reports FM. Monitors applied and assessing. VSS. Initial fetal heart tone 140. CNM notified of patients arrival to unit.

## 2022-11-06 ENCOUNTER — Inpatient Hospital Stay
Admission: EM | Admit: 2022-11-06 | Discharge: 2022-11-08 | DRG: 807 | Disposition: A | Discharge status: Home / Self Care | Payer: Medicaid Other

## 2022-11-06 ENCOUNTER — Encounter: Payer: Self-pay | Admitting: Obstetrics and Gynecology

## 2022-11-06 ENCOUNTER — Other Ambulatory Visit: Payer: Self-pay

## 2022-11-06 DIAGNOSIS — O9952 Diseases of the respiratory system complicating childbirth: Secondary | ICD-10-CM | POA: Diagnosis present

## 2022-11-06 DIAGNOSIS — Z87891 Personal history of nicotine dependence: Secondary | ICD-10-CM

## 2022-11-06 DIAGNOSIS — Z3A38 38 weeks gestation of pregnancy: Secondary | ICD-10-CM | POA: Diagnosis not present

## 2022-11-06 DIAGNOSIS — J45909 Unspecified asthma, uncomplicated: Secondary | ICD-10-CM | POA: Diagnosis present

## 2022-11-06 DIAGNOSIS — O26893 Other specified pregnancy related conditions, third trimester: Secondary | ICD-10-CM | POA: Diagnosis present

## 2022-11-06 DIAGNOSIS — O479 False labor, unspecified: Principal | ICD-10-CM | POA: Diagnosis present

## 2022-11-06 LAB — CBC
HCT: 39.7 % (ref 36.0–46.0)
Hemoglobin: 13.9 g/dL (ref 12.0–15.0)
MCH: 33.3 pg (ref 26.0–34.0)
MCHC: 35 g/dL (ref 30.0–36.0)
MCV: 95.2 fL (ref 80.0–100.0)
Platelets: 198 10*3/uL (ref 150–400)
RBC: 4.17 MIL/uL (ref 3.87–5.11)
RDW: 13 % (ref 11.5–15.5)
WBC: 10.5 10*3/uL (ref 4.0–10.5)
nRBC: 0 % (ref 0.0–0.2)

## 2022-11-06 LAB — TYPE AND SCREEN
ABO/RH(D): A POS
Antibody Screen: NEGATIVE

## 2022-11-06 LAB — ABO/RH: ABO/RH(D): A POS

## 2022-11-06 MED ORDER — SODIUM CHLORIDE 0.9% FLUSH: 3.0000 mL | Freq: Two times a day (BID) | INTRAVENOUS | Status: DC

## 2022-11-06 MED ORDER — WITCH HAZEL-GLYCERIN EX PADS: 1.0000 | MEDICATED_PAD | CUTANEOUS | Status: DC | PRN

## 2022-11-06 MED ORDER — DIPHENHYDRAMINE HCL 25 MG PO CAPS: 25.0000 mg | ORAL_CAPSULE | Freq: Four times a day (QID) | ORAL | Status: DC | PRN

## 2022-11-06 MED ORDER — OXYTOCIN BOLUS FROM INFUSION
333.0000 mL | Freq: Once | INTRAVENOUS | Status: AC
  Administered 2022-11-06: 333 mL via INTRAVENOUS

## 2022-11-06 MED ORDER — FERROUS SULFATE 325 (65 FE) MG PO TABS
325.0000 mg | ORAL_TABLET | Freq: Two times a day (BID) | ORAL | Status: DC
  Administered 2022-11-07 – 2022-11-08 (×3): 325 mg via ORAL
  Filled 2022-11-06 (×3): qty 1

## 2022-11-06 MED ORDER — IBUPROFEN 600 MG PO TABS
600.0000 mg | ORAL_TABLET | Freq: Four times a day (QID) | ORAL | Status: DC
  Administered 2022-11-06 – 2022-11-08 (×7): 600 mg via ORAL
  Filled 2022-11-06 (×7): qty 1

## 2022-11-06 MED ORDER — ONDANSETRON HCL 4 MG/2ML IJ SOLN: 4.0000 mg | Freq: Four times a day (QID) | INTRAMUSCULAR | Status: DC | PRN

## 2022-11-06 MED ORDER — TERBUTALINE SULFATE 1 MG/ML IJ SOLN
0.2500 mg | Freq: Once | INTRAMUSCULAR | Status: AC
  Administered 2022-11-06: 0.25 mg via SUBCUTANEOUS

## 2022-11-06 MED ORDER — ACETAMINOPHEN 500 MG PO TABS
1000.0000 mg | ORAL_TABLET | Freq: Four times a day (QID) | ORAL | Status: DC | PRN
  Administered 2022-11-06 (×2): 1000 mg via ORAL
  Filled 2022-11-06 (×2): qty 2

## 2022-11-06 MED ORDER — SENNOSIDES-DOCUSATE SODIUM 8.6-50 MG PO TABS
2.0000 | ORAL_TABLET | Freq: Every day | ORAL | Status: DC
  Administered 2022-11-07 – 2022-11-08 (×2): 2 via ORAL
  Filled 2022-11-06 (×2): qty 2

## 2022-11-06 MED ORDER — ONDANSETRON HCL 4 MG/2ML IJ SOLN: 4.0000 mg | INTRAMUSCULAR | Status: DC | PRN

## 2022-11-06 MED ORDER — SODIUM CHLORIDE 0.9% FLUSH: 3.0000 mL | INTRAVENOUS | Status: DC | PRN

## 2022-11-06 MED ORDER — METHYLERGONOVINE MALEATE 0.2 MG/ML IJ SOLN
0.2000 mg | Freq: Once | INTRAMUSCULAR | Status: AC
  Administered 2022-11-06: 0.2 mg via INTRAMUSCULAR
  Filled 2022-11-06: qty 1

## 2022-11-06 MED ORDER — LACTATED RINGERS IV SOLN: INTRAVENOUS | Status: DC

## 2022-11-06 MED ORDER — LIDOCAINE HCL (PF) 1 % IJ SOLN
30.0000 mL | INTRAMUSCULAR | Status: AC | PRN
  Administered 2022-11-06: 30 mL via SUBCUTANEOUS

## 2022-11-06 MED ORDER — FENTANYL CITRATE (PF) 100 MCG/2ML IJ SOLN
50.0000 ug | INTRAMUSCULAR | Status: DC | PRN
  Administered 2022-11-06 (×2): 100 ug via INTRAVENOUS
  Filled 2022-11-06 (×2): qty 2

## 2022-11-06 MED ORDER — BUPIVACAINE HCL (PF) 0.25 % IJ SOLN
INTRAMUSCULAR | Status: AC
  Filled 2022-11-06: qty 30

## 2022-11-06 MED ORDER — WITCH HAZEL-GLYCERIN EX PADS
MEDICATED_PAD | CUTANEOUS | Status: AC
  Filled 2022-11-06: qty 100

## 2022-11-06 MED ORDER — BENZOCAINE-MENTHOL 20-0.5 % EX AERO
1.0000 | INHALATION_SPRAY | CUTANEOUS | Status: DC | PRN
  Administered 2022-11-06: 1 via TOPICAL
  Filled 2022-11-06: qty 56

## 2022-11-06 MED ORDER — SODIUM CHLORIDE 0.9 % IV SOLN
INTRAVENOUS | Status: AC
  Filled 2022-11-06: qty 5

## 2022-11-06 MED ORDER — OXYTOCIN-SODIUM CHLORIDE 30-0.9 UT/500ML-% IV SOLN
2.5000 [IU]/h | INTRAVENOUS | Status: DC
  Administered 2022-11-06: 2.5 [IU]/h via INTRAVENOUS
  Filled 2022-11-06: qty 500

## 2022-11-06 MED ORDER — ACETAMINOPHEN 500 MG PO TABS
1000.0000 mg | ORAL_TABLET | Freq: Four times a day (QID) | ORAL | Status: DC
  Administered 2022-11-07 – 2022-11-08 (×6): 1000 mg via ORAL
  Filled 2022-11-06 (×6): qty 2

## 2022-11-06 MED ORDER — ZOLPIDEM TARTRATE 5 MG PO TABS: 5.0000 mg | ORAL_TABLET | Freq: Every evening | ORAL | Status: DC | PRN

## 2022-11-06 MED ORDER — SODIUM CHLORIDE 0.9 % IV SOLN: 250.0000 mL | INTRAVENOUS | Status: DC | PRN

## 2022-11-06 MED ORDER — SIMETHICONE 80 MG PO CHEW: 80.0000 mg | CHEWABLE_TABLET | ORAL | Status: DC | PRN

## 2022-11-06 MED ORDER — SOD CITRATE-CITRIC ACID 500-334 MG/5ML PO SOLN
ORAL | Status: AC
  Filled 2022-11-06: qty 15

## 2022-11-06 MED ORDER — ONDANSETRON HCL 4 MG PO TABS: 4.0000 mg | ORAL_TABLET | ORAL | Status: DC | PRN

## 2022-11-06 MED ORDER — SOD CITRATE-CITRIC ACID 500-334 MG/5ML PO SOLN: 30.0000 mL | ORAL | Status: DC | PRN

## 2022-11-06 MED ORDER — COCONUT OIL OIL: 1.0000 | TOPICAL_OIL | Status: DC | PRN

## 2022-11-06 MED ORDER — TERBUTALINE SULFATE 1 MG/ML IJ SOLN
INTRAMUSCULAR | Status: AC
  Filled 2022-11-06: qty 1

## 2022-11-06 MED ORDER — LACTATED RINGERS IV SOLN
500.0000 mL | INTRAVENOUS | Status: DC | PRN
  Administered 2022-11-06 (×2): 500 mL via INTRAVENOUS

## 2022-11-06 MED ORDER — DIBUCAINE (PERIANAL) 1 % EX OINT
1.0000 | TOPICAL_OINTMENT | CUTANEOUS | Status: DC | PRN
  Administered 2022-11-07: 1 via RECTAL
  Filled 2022-11-06: qty 28

## 2022-11-06 MED ORDER — CALCIUM CARBONATE ANTACID 500 MG PO CHEW: 2.0000 | CHEWABLE_TABLET | ORAL | Status: DC | PRN

## 2022-11-06 MED ORDER — CEFAZOLIN SODIUM-DEXTROSE 2-4 GM/100ML-% IV SOLN
INTRAVENOUS | Status: AC
  Filled 2022-11-06: qty 100

## 2022-11-06 MED ORDER — PRENATAL MULTIVITAMIN CH
1.0000 | ORAL_TABLET | Freq: Every day | ORAL | Status: DC
  Administered 2022-11-07: 1 via ORAL
  Filled 2022-11-06: qty 1

## 2022-11-06 NOTE — OB Triage Note (Signed)
Pt presents to L/D triage with reported contractions since yesterday afternoon that have continued in frequency and intensity. Pt reports pain 8/10- no bleeding or LOF and positive fetal movement. Monitors applied and assessing- initial FHT 140. VSS. Pt PO hydrating.  SVE 3/90/0 by CNM.  - Labor Eval.

## 2022-11-06 NOTE — Discharge Instructions (Signed)
Vaginal Delivery, Care After Refer to this sheet in the next few weeks. These discharge instructions provide you with information on caring for yourself after delivery. Your caregiver may also give you specific instructions. Your treatment has been planned according to the most current medical practices available, but problems sometimes occur. Call your caregiver if you have any problems or questions after you go home. HOME CARE INSTRUCTIONS Take over-the-counter or prescription medicines only as directed by your caregiver or pharmacist. Do not drink alcohol, especially if you are breastfeeding or taking medicine to relieve pain. Do not smoke tobacco. Continue to use good perineal care. Good perineal care includes: Wiping your perineum from back to front Keeping your perineum clean. You can do sitz baths twice a day, to help keep this area clean Do not use tampons, douche or have sex until your caregiver says it is okay. Shower only and avoid sitting in submerged water, aside from sitz baths Wear a well-fitting bra that provides breast support. Eat healthy foods. Drink enough fluids to keep your urine clear or pale yellow. Eat high-fiber foods such as whole grain cereals and breads, brown rice, beans, and fresh fruits and vegetables every day. These foods may help prevent or relieve constipation. Avoid constipation with high fiber foods or medications, such as miralax or metamucil Follow your caregiver's recommendations regarding resumption of activities such as climbing stairs, driving, lifting, exercising, or traveling. Talk to your caregiver about resuming sexual activities. Resumption of sexual activities is dependent upon your risk of infection, your rate of healing, and your comfort and desire to resume sexual activity. Try to have someone help you with your household activities and your newborn for at least a few days after you leave the hospital. Rest as much as possible. Try to rest or  take a nap when your newborn is sleeping. Increase your activities gradually. Keep all of your scheduled postpartum appointments. It is very important to keep your scheduled follow-up appointments. At these appointments, your caregiver will be checking to make sure that you are healing physically and emotionally. SEEK MEDICAL CARE IF:  You are passing large clots from your vagina. Save any clots to show your caregiver. You have a foul smelling discharge from your vagina. You have trouble urinating. You are urinating frequently. You have pain when you urinate. You have a change in your bowel movements. You have increasing redness, pain, or swelling near your vaginal incision (episiotomy) or vaginal tear. You have pus draining from your episiotomy or vaginal tear. Your episiotomy or vaginal tear is separating. You have painful, hard, or reddened breasts. You have a severe headache. You have blurred vision or see spots. You feel sad or depressed. You have thoughts of hurting yourself or your newborn. You have questions about your care, the care of your newborn, or medicines. You are dizzy or light-headed. You have a rash. You have nausea or vomiting. You were breastfeeding and have not had a menstrual period within 12 weeks after you stopped breastfeeding. You are not breastfeeding and have not had a menstrual period by the 12th week after delivery. You have a fever. SEEK IMMEDIATE MEDICAL CARE IF:  You have persistent pain. You have chest pain. You have shortness of breath. You faint. You have leg pain. You have stomach pain. Your vaginal bleeding saturates two or more sanitary pads in 1 hour. MAKE SURE YOU:  Understand these instructions. Will watch your condition. Will get help right away if you are not doing well or   get worse. Document Released: 07/07/2000 Document Revised: 11/24/2013 Document Reviewed: 03/06/2012 ExitCare Patient Information 2015 [employer]. This  information is not intended to replace advice given to you by your health care provider. Make sure you discuss any questions you have with your health care provider.  Sitz Bath A sitz bath is a warm water bath taken in the sitting position. The water covers only the hips and butt (buttocks). We recommend using one that fits in the toilet, to help with ease of use and cleanliness. It may be used for either healing or cleaning purposes. Sitz baths are also used to relieve pain, itching, or muscle tightening (spasms). The water may contain medicine. Moist heat will help you heal and relax.  HOME CARE  Take 3 to 4 sitz baths a day. Fill the bathtub half-full with warm water. Sit in the water and open the drain a little. Turn on the warm water to keep the tub half-full. Keep the water running constantly. Soak in the water for 15 to 20 minutes. After the sitz bath, pat the affected area dry. GET HELP RIGHT AWAY IF: You get worse instead of better. Stop the sitz baths if you get worse. MAKE SURE YOU: Understand these instructions. Will watch your condition. Will get help right away if you are not doing well or get worse. Document Released: 08/17/2004 Document Revised: 04/03/2012 Document Reviewed: 11/07/2010 ExitCare Patient Information 2015 [employer]. This information is not intended to replace advice given to you by your health care provider. Make sure you discuss any questions you have with your health care provider.   

## 2022-11-06 NOTE — H&P (Signed)
OB History & Physical   History of Present Illness:   Chief Complaint: uterine contractions   HPI:  Judith Henson is a 22 y.o. G1P0000 female at [redacted]w[redacted]d, Patient's last menstrual period was 02/10/2022 (exact date)., consistent with [place] at [redacted]w[redacted]d, with Estimated Date of Delivery: 11/17/22.  She presents to L&D for contractions that have been ongoing since 0900 yesterday.  Contractions have been consistently 3-5 minutes and starting to get worse over the past couple of hours.  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 Authorizing Provider  albuterol (VENTOLIN HFA) 108 (90 Base) MCG/ACT inhaler Inhale 2 puffs into the lungs every 6 (six) hours as needed for wheezing or shortness of breath. 05/14/21  Yes Minna Antis, MD  ibuprofen (ADVIL) 600 MG tablet Take 1 tablet (600 mg total) by mouth every 6 (six) hours as needed. Patient not taking: Reported on 11/05/2022 10/10/21   Mickie Bail, NP   lidocaine (XYLOCAINE) 2 % solution Use as directed 15 mLs in the mouth or throat as needed for mouth pain. Patient not taking: Reported on 11/05/2022 10/10/21   Mickie Bail, NP  Multiple Vitamins-Minerals (MULTIVITAMIN) tablet Take 1 tablet by mouth daily. Patient not taking: Reported on 11/05/2022 10/08/19   Federico Flake, MD  ondansetron (ZOFRAN-ODT) 4 MG disintegrating tablet Take 1 tablet (4 mg total) by mouth every 8 (eight) hours as needed for nausea or vomiting. Patient not taking: Reported on 11/05/2022 08/25/21   Shaune Pollack, MD  Prenatal MV & Min w/FA-DHA (PRENATAL GUMMIES PO) Take by mouth.    [provider]     Prenatal care site:  [facility]  OB History  Gravida Para Term Preterm AB Living  1 0 0 0 0 0  SAB IAB Ectopic Multiple Live Births  0 0 0 0 0    # Outcome Date GA Lbr Len/2nd Weight Sex Delivery Anes PTL Lv  1 Current              Social History: She  reports that she quit smoking about 2 months ago. Her smoking use included cigarettes. She has never used smokeless tobacco. She reports that she does not drink alcohol and does not use drugs.  Family History: family history is not on file.   Review of Systems: A full review of systems was performed and negative except as noted in the HPI.     Physical Exam:  Vital Signs: BP 119/77   Pulse 93   Temp 98.2 F (36.8  C) (Oral)   Resp 18   Ht  (1.651 m)   Wt 82.6 kg   LMP 02/10/2022 (Exact Date)   BMI 30.29 kg/m   General: no acute distress.  HEENT: normocephalic, atraumatic Heart: regular rate & rhythm Lungs: normal respiratory effort Abdomen: soft, gravid, non-tender;  EFW: 7 lbs  Pelvic:   External: Normal external female genitalia  Cervix: Dilation: 6.5 / Effacement (%): 90 / Station: 0    Extremities: non-tender, symmetric, No edema bilaterally.  DTRs: 2+/2+  Neurologic: Alert & oriented x 3.    No results found for this or any previous visit (from the past 24  hour(s)).  Pertinent Results:  Prenatal Labs: Blood type/Rh A Negative    Antibody screen Negative    Rubella Immune (11/03 0000)   Varicella Immune  RPR NR  HBsAg Negative (11/03 0000)  Hep C NR   HIV Non-reactive (11/03 0000)   GC neg  Chlamydia neg  Genetic screening cfDNA negative/AFP neg  1 hour GTT 117  3 hour GTT N/A  GBS Neg     FHT:  FHR: 140 bpm, variability: moderate,  accelerations:  Present,  decelerations:  Absent Category/reactivity:  Category I UC:   regular, every 3-5 minutes   Cephalic by [place] and SVE   No results found.  Assessment:  Judith Henson is a 22 y.o. G1P0000 female at [redacted]w[redacted]d with normal labor.   Plan:  1. Admit to Labor & Delivery - consents reviewed and obtained - Dr. Feliberto Gottron notified of admission and plan of care   2. Fetal Well being  - Fetal Tracing: cat 1 - Group B Streptococcus ppx not indicated: GBS negative - Presentation: cephalic confirmed by SVE   3. Routine OB: - Prenatal labs reviewed, as above - Rh positive - CBC, T&S, RPR on admit - Clear liquid diet , saline lock  4. Monitoring of labor  - Contractions monitored with external toco - Pelvis adequate for trial of labor  - Plan for expectant management  - Augmentation with oxytocin and AROM as appropriate  - Plan for  continuous fetal monitoring - Maternal pain control as desired - Anticipate vaginal delivery  5. Post Partum Planning: - Infant feeding: formula feeding - Contraception:  Nexplanon - considering Liletta versus NuvaRing also - Flu vaccine: Given prenatally - Tdap vaccine: Given prenatally - RSV vaccine:  Not indicated   Gustavo Lah, Ina Homes 11/06/22 12:34 PM  Margaretmary Eddy, CNM Certified Nurse Midwife [facility]

## 2022-11-06 NOTE — Progress Notes (Signed)
L&D Note    Subjective:  Contractions feeling more intense and starting to feel pressure.  Coping well with them, family supportive at bedside.  Objective:   Vitals:   11/06/22 0930 11/06/22 0932 11/06/22 1518 11/06/22 1521  BP: 119/77  112/73 112/73  Pulse: 93   (!) 104  Resp:  18    Temp:  98.2 F (36.8 C) 97.8 F (36.6 C)   TempSrc:  Oral Oral   Weight:  82.6 kg    Height:  5\' 5"  (1.651 m)      Current Vital Signs 24h Vital Sign Ranges  T 97.8 F (36.6 C) Temp  Avg: 98 F (36.7 C)  Min: 97.8 F (36.6 C)  Max: 98.2 F (36.8 C)  BP 112/73 BP  Min: 112/73  Max: 119/77  HR (!) 104 Pulse  Avg: 98.5  Min: 93  Max: 104  RR 18 Resp  Avg: 18  Min: 18  Max: 18  SaO2     No data recorded      Gen: alert, cooperative, no distress FHR: Baseline: 125 bpm, Variability: moderate, Accels: Present, Decels: variable and prolong decel Toco: regular, every 2-3 minutes SVE: Dilation: 9 Effacement (%): 90 Cervical Position: Anterior Station: 0 Presentation: Vertex Exam by:: Obdulio Mash CNM  Medications SCHEDULED MEDICATIONS   oxytocin 40 units in LR 1000 mL  333 mL Intravenous Once   sodium chloride flush  3 mL Intravenous Q12H    MEDICATION INFUSIONS   sodium chloride     lactated ringers 500 mL (11/06/22 1510)   lactated ringers 125 mL/hr at 11/06/22 1321   oxytocin      PRN MEDICATIONS  sodium chloride, acetaminophen, calcium carbonate, fentaNYL (SUBLIMAZE) injection, lactated ringers, lidocaine (PF), ondansetron, sodium chloride flush, sodium citrate-citric acid   Assessment & Plan:  22 y.o. G1P0000 at [redacted]w[redacted]d admitted for normal labor -Labor: Active phase labor.  Progressing well. -Fetal Well-being: Category II - Intermittent variable decels and prolong decel.  Resolved when positioned in hands and knees.  -GBS: negative -Membranes intact -Continue present management. -Analgesia: unmedicated labor support options    Gustavo Lah, [place]  11/06/2022 3:31 PM  [facility]

## 2022-11-06 NOTE — Discharge Summary (Signed)
Obstetrical Discharge Summary  Patient Name: Stormy Connon DOB: [birth year] MRN: [id number]  Date of Admission: 11/06/2022 Date of Delivery: 11/06/2022 Delivered by: Dr. Jennell Corner  Date of Discharge: 11/08/2022  [facility] [id number] last menstrual period was 02/10/2022 (exact date). 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  She was expectantly managed.  Fetal bradycardia noted after SROM with thick meconium.  Intrauterine resuscitation measures used to support FHR changes.  Elizah pushed will with contractions for a spontaneous vaginal delivery.  Delivery Type: spontaneous vaginal delivery Anesthesia: IV narcotics Placenta: spontaneous To Pathology: No  Laceration: 1st degree peri-clitoral and hymenal with repair  Episiotomy: none Newborn Data: Live born female  Birth Weight: 7 lb 0.5 oz (3190 g) APGAR: 8, 9   Newborn Delivery   Birth date/time: 11/06/2022 16:46:00 Delivery type: Vaginal, Spontaneous      Postpartum Procedures: none [place]:     11/07/2022    8:50 AM  Inocente Salles Postnatal Depression Scale Screening Tool  I have been able to laugh and see the funny side of things. 0  I have looked forward with enjoyment to things. 0  I  have blamed myself unnecessarily when things went wrong. 1  I have been anxious or worried for no good reason. 0  I have felt scared or panicky for no good reason. 0  Things have been getting on top of me. 0  I have been so unhappy that I have had difficulty sleeping. 0  I have felt sad or miserable. 0  I have been so unhappy that I have been crying. 0  The thought of harming myself has occurred to me. 0  [place] Postnatal Depression Scale Total 1     Post partum course:  Patient had an uncomplicated postpartum course.  By time of discharge on PPD#2, her pain was controlled on oral pain medications; she had appropriate lochia and was ambulating, voiding without difficulty and tolerating regular diet.  She was deemed stable for discharge to home.    Discharge Physical Exam:  BP 108/72 (BP Location: Left Arm)   Pulse 84   Temp 98.1 F (36.7 C) (Oral)   Resp 20   Ht  (1.651 m)   Wt 82.6 kg   LMP 02/10/2022 (Exact Date)   SpO2 99%   Breastfeeding Unknown   BMI 30.29 kg/m   General: NAD CV: RRR Pulm: CTABL, nl effort ABD: s/nd/nt, fundus firm and below the umbilicus Lochia: moderate Perineum: minimal edema/repair well approximated DVT Evaluation: LE non-ttp, no evidence of DVT on exam.  Hemoglobin  Date Value Ref Range Status  11/07/2022 11.1 (L) 12.0 - 15.0 g/dL Final  65/78/4696 29.5  Final   HCT  Date Value Ref Range Status  11/07/2022 33.3 (L) 36.0 - 46.0 % Final  10/23/2022 37 29 - 41 Final    Risk assessment for postpartum VTE  and prophylactic treatment: Very high risk factors: None High risk factors: None Moderate risk factors: BMI 30-40 kg/m2  Postpartum VTE prophylaxis with LMWH not indicated  Disposition: stable, discharge to home. Baby Feeding: formula feeding Baby Disposition: home with mom  Rh Immune globulin indicated: No Rubella vaccine given: was not indicated Varivax vaccine given: was not indicated Flu vaccine given in AP setting: Yes   Tdap vaccine given in AP setting: Yes   Contraception:  Nexplanon versus NuvaRing  Prenatal Labs:  Blood type/Rh A Negative    Antibody screen Negative    Rubella Immune (11/03 0000)   Varicella Immune  RPR NR  HBsAg Negative (11/03 0000)  Hep C NR   HIV Non-reactive (11/03 0000)   GC neg  Chlamydia neg  Genetic screening cfDNA negative/AFP neg  1 hour GTT 117  3 hour GTT N/A  GBS Neg      Plan:  Aanyah Alexus Alexanderia Minniear was discharged to home in good condition. Follow-up appointment with [facility] in 2 weeks for postpartum mood check and with delivering provider in 6 weeks.  Discharge Medications: Allergies as of 11/08/2022   No Known Allergies      Medication List     TAKE these medications    acetaminophen 500 MG tablet Commonly known as: TYLENOL Take 2 tablets (1,000 mg total) by mouth every 6 (six) hours.   albuterol 108 (90 Base) MCG/ACT inhaler Commonly known as: VENTOLIN HFA Inhale 2 puffs into the lungs every 6 (six) hours as needed for wheezing or shortness of breath.   ibuprofen 600 MG tablet Commonly known as: ADVIL Take 1 tablet (600 mg total) by mouth every 6 (six) hours as needed for mild pain or cramping.   PRENATAL GUMMIES PO Take by mouth.         Follow-up Information     [facility]. Schedule an appointment as soon as possible for a visit in 2 week(s).   Why: postpartum mood check Contact information: [address] [place] [address] [phone]        Schermerhorn, Ihor Austin, MD. Schedule an appointment as soon as possible for a visit in 6 week(s).   Specialty: Obstetrics and Gynecology Why: postpartum visit and Nexplanon insertion.  Let us know if you desire and IUD instead. Contact information: [address] [place] [address] [phone]                 Signed:  Margaretmary Eddy, CNM Certified Nurse Midwife [facility]

## 2022-11-06 NOTE — Progress Notes (Signed)
S: feeling pressure   O: Brady cardia after SROM, intermittent increases in FHR with return to 90's in baseline.  Patient repositioned multiple times, IVF bolus given, terb given.  Dr. Feliberto Gottron requested to bedside for evaluation.  SVE 9.5/90/+1  A: fetal bradycardia after SROM with thick meconium stained fluid   P:  -Intrauterine resuscitation with IVF bolus, position change, and terb -Quickly discussed 2nd stage interventions to include forceps and vacuum.  Reviewed that if delivery was not immanent or not able to use an option to expedite a vaginal delivery then we would recommend proceeding with urgent c/section.   -Mother and FOB at bedside.  Understand interventions and recommendations.  Judith Henson is coping well with contractions.   -Dr. Feliberto Gottron to evaluate   Margaretmary Eddy, CNM Certified Nurse Midwife [facility]

## 2022-11-07 LAB — CBC
HCT: 33.3 % — ABNORMAL LOW (ref 36.0–46.0)
Hemoglobin: 11.1 g/dL — ABNORMAL LOW (ref 12.0–15.0)
MCH: 31.9 pg (ref 26.0–34.0)
MCHC: 33.3 g/dL (ref 30.0–36.0)
MCV: 95.7 fL (ref 80.0–100.0)
Platelets: 168 10*3/uL (ref 150–400)
RBC: 3.48 MIL/uL — ABNORMAL LOW (ref 3.87–5.11)
RDW: 13.1 % (ref 11.5–15.5)
WBC: 14.6 10*3/uL — ABNORMAL HIGH (ref 4.0–10.5)
nRBC: 0 % (ref 0.0–0.2)

## 2022-11-07 LAB — RPR: RPR Ser Ql: NONREACTIVE

## 2022-11-07 NOTE — Progress Notes (Signed)
Post Partum Day 1  Subjective: Doing well, no concerns. Ambulating without difficulty, pain managed with PO meds, tolerating regular diet, and voiding without difficulty.   No fever/chills, chest pain, shortness of breath, nausea/vomiting, or leg pain. No nipple or breast pain. No headache, visual changes, or RUQ/epigastric pain.  Objective: BP (!) 109/58 (BP Location: Left Arm)   Pulse 91   Temp 98.4 F (36.9 C) (Oral)   Resp 18   Ht  (1.651 m)   Wt 82.6 kg   LMP 02/10/2022 (Exact Date)   SpO2 95%   Breastfeeding Unknown   BMI 30.29 kg/m    Physical Exam:  General: alert and cooperative Breasts: soft/nontender CV: RRR Pulm: nl effort Abdomen: soft, non-tender, active bowel sounds Uterine Fundus: firm Incision: n/a Perineum: minimal edema, repair well approximated Lochia: appropriate DVT Evaluation: No evidence of DVT seen on physical exam. Edinburgh:      No data to display           Recent Labs    11/06/22 1254 11/07/22 0618  HGB 13.9 11.1*  HCT 39.7 33.3*  WBC 10.5 14.6*  PLT 198 168    Assessment/Plan: 22 y.o. G1P1001 postpartum day # 1  -Continue routine postpartum care -Lactation consult PRN for breastfeeding   -Immunization status: all immunizations up to date  Disposition: Continue inpatient postpartum care    LOS: 1 day   Talene Glastetter, CNM 11/07/2022, 9:10 AM

## 2022-11-08 MED ORDER — IBUPROFEN 600 MG PO TABS: 600.0000 mg | ORAL_TABLET | Freq: Four times a day (QID) | ORAL | 0 refills | Status: DC | PRN

## 2022-11-08 MED ORDER — ACETAMINOPHEN 500 MG PO TABS: 1000.0000 mg | ORAL_TABLET | Freq: Four times a day (QID) | ORAL | 0 refills | Status: DC

## 2022-11-08 NOTE — Progress Notes (Signed)
Patient discharged. Discharge instructions given. Patient verbalizes understanding. Transported by axillary. 

## 2022-11-20 ENCOUNTER — Telehealth: Payer: Self-pay

## 2022-11-20 NOTE — Telephone Encounter (Signed)
PP nurse left message to return call for a PPHV or if patient has any questions about herself or her newborn. Left number and cell phone number

## 2022-11-29 ENCOUNTER — Telehealth: Payer: Self-pay

## 2022-11-29 NOTE — Telephone Encounter (Signed)
[facility]- Discharge Call Backs-Spoke to patient on the phone about the following below. 1-Do you have any questions or concerns about yourself as you heal?  No 2-Any concerns or questions about your baby? No 3- Reviewed ABC's of safe sleep. 4-How was your stay at the hospital?Good 5- Did our team work together to care for you?Yes You should be receiving a survey in the mail soon.   We would really appreciate it if you could fill that out for us and return it in the mail.  We value the feedback to make improvements and continue the great work we do.   If you have any questions please feel free to call me back at [phone]  

## 2023-02-28 IMAGING — CR DG CHEST 2V
1 series · 2 of 2 positions shown · non-contrast
Comparison: Chest radiograph dated 10/12/2020.

CLINICAL DATA: Chest pain.

EXAM:
CHEST - 2 VIEW

[Series 1: dg chest 2 view · 0.14mm/px · 2 of 2 slices shown]
[im 1/2]
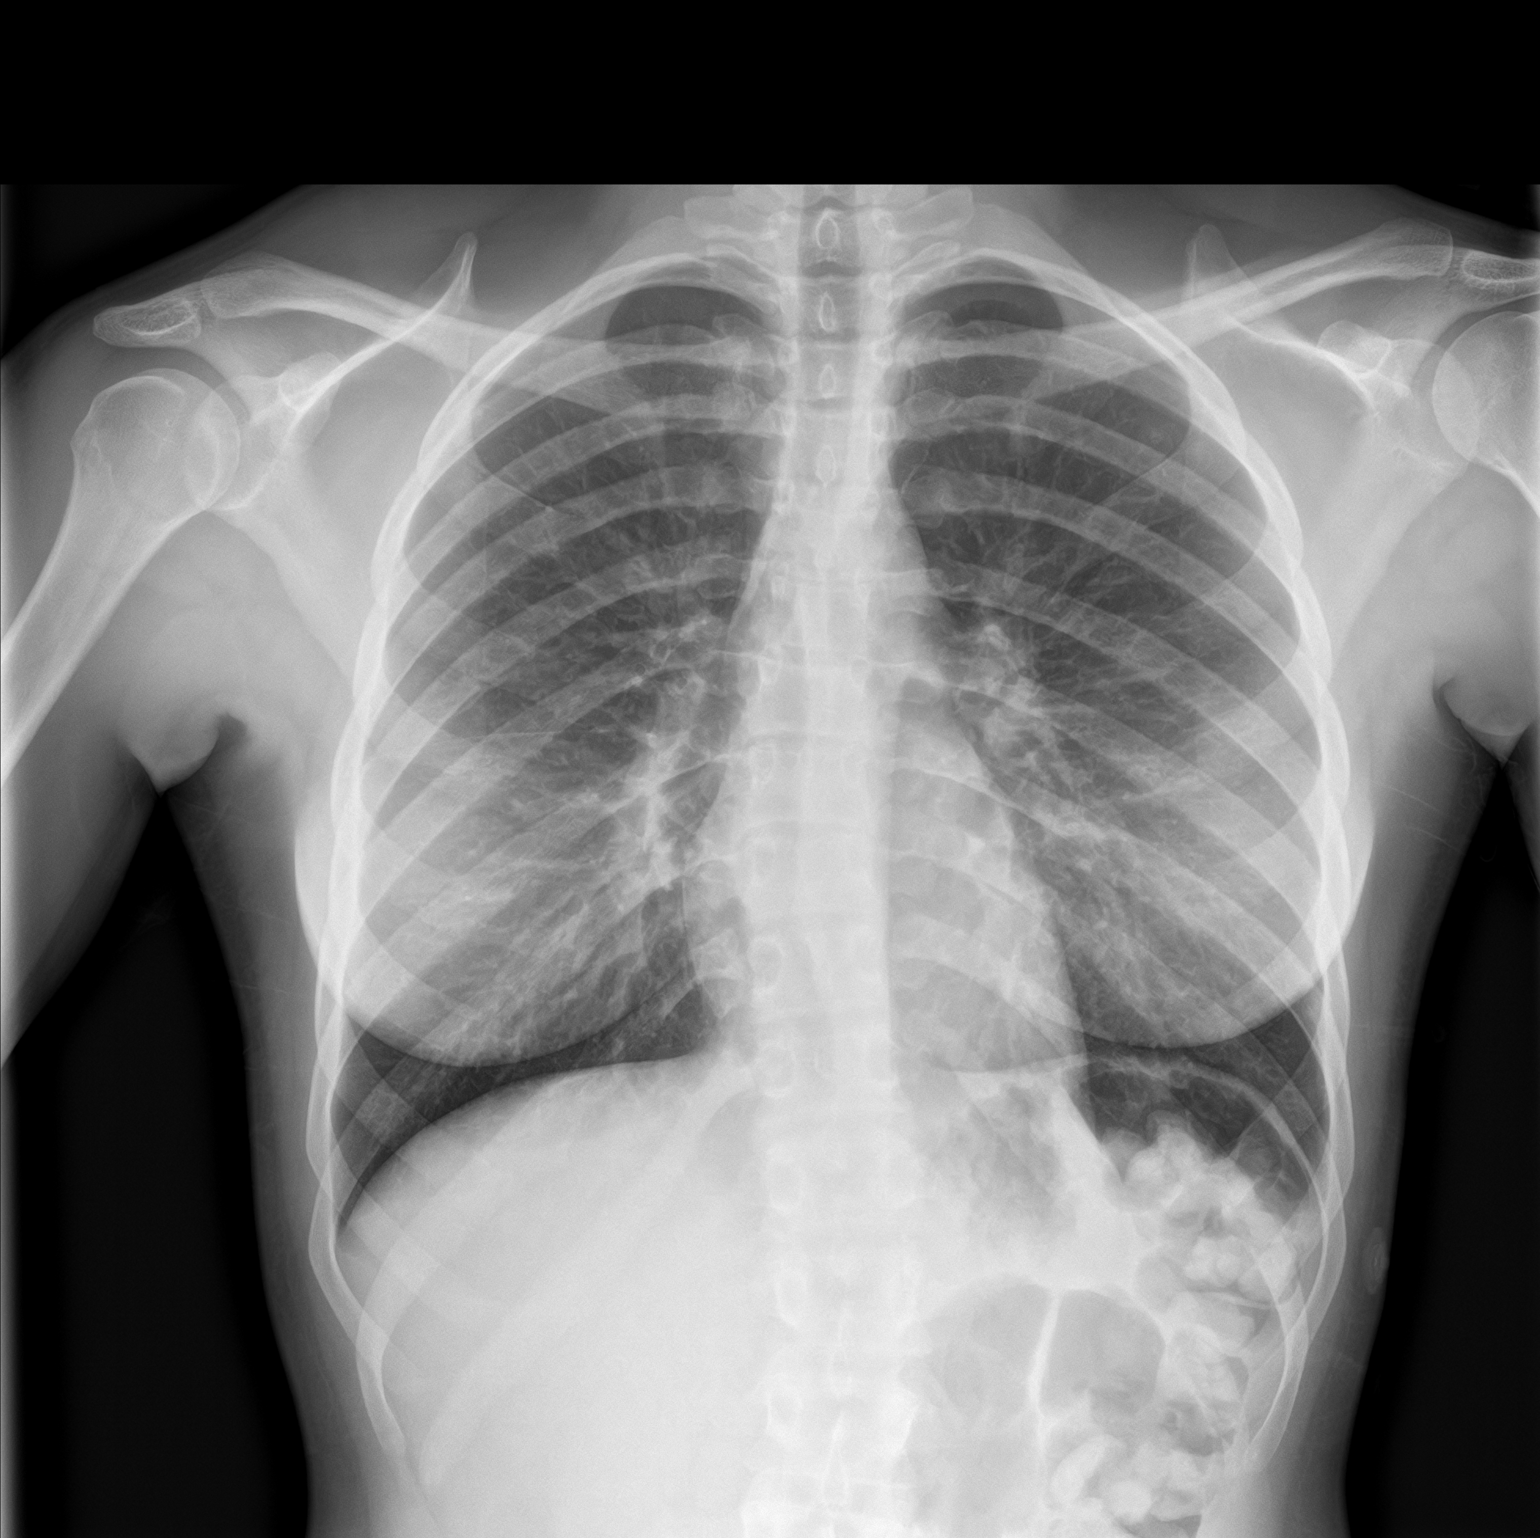
[im 2/2]
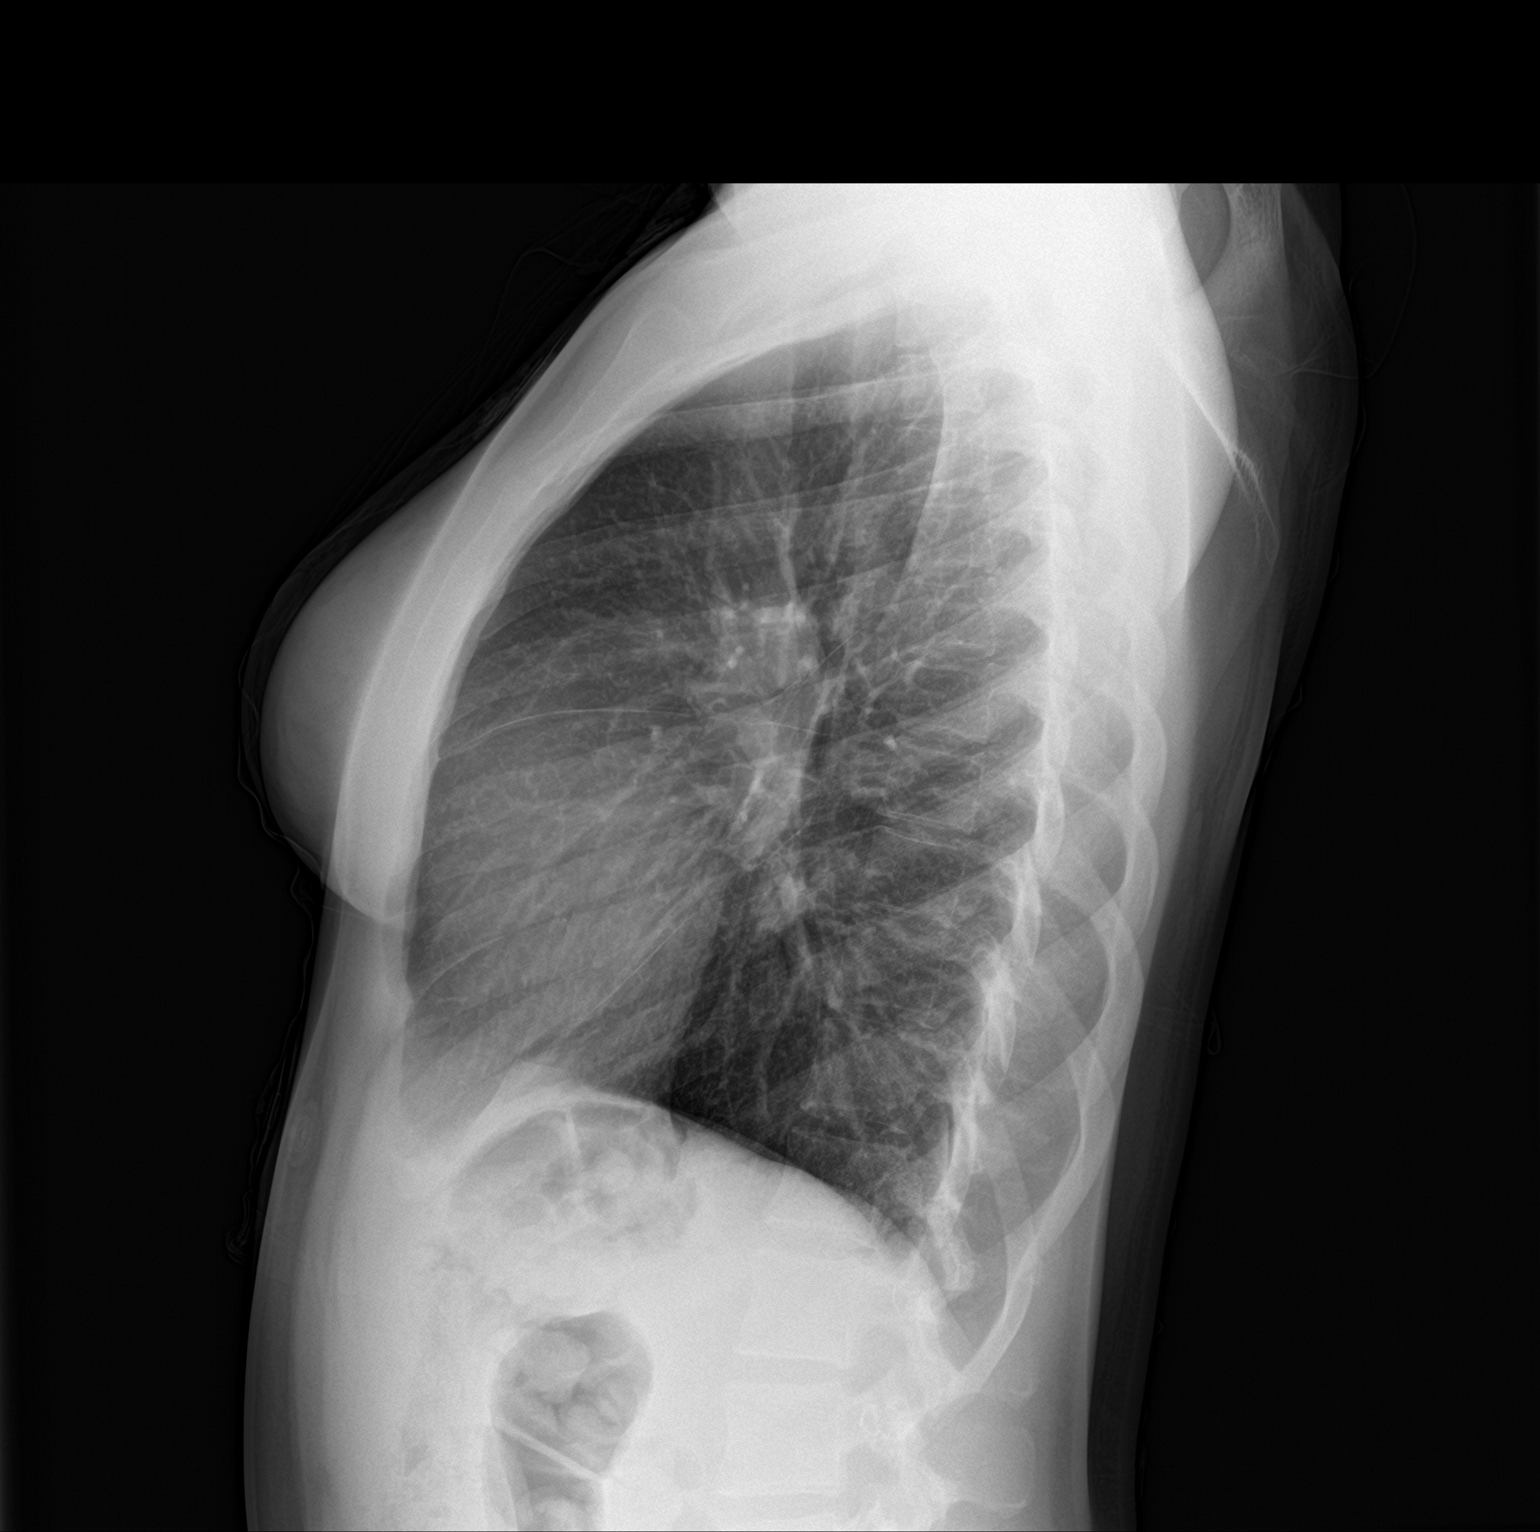

[2 of 2 positions shown; findings below may reference images not displayed]

FINDINGS: The heart size and mediastinal contours are within normal limits.
Both lungs are clear. The visualized skeletal structures are
unremarkable.
IMPRESSION: No active cardiopulmonary disease.

## 2023-06-13 DIAGNOSIS — O0993 Supervision of high risk pregnancy, unspecified, third trimester: Secondary | ICD-10-CM | POA: Insufficient documentation

## 2023-06-18 LAB — OB RESULTS CONSOLE VARICELLA ZOSTER ANTIBODY, IGG: Varicella: IMMUNE

## 2023-06-18 LAB — OB RESULTS CONSOLE HEPATITIS B SURFACE ANTIGEN: Hepatitis B Surface Ag: NEGATIVE

## 2023-06-18 LAB — OB RESULTS CONSOLE RUBELLA ANTIBODY, IGM: Rubella: IMMUNE

## 2023-07-05 ENCOUNTER — Other Ambulatory Visit: Payer: Self-pay | Admitting: Certified Nurse Midwife

## 2023-07-05 DIAGNOSIS — O30041 Twin pregnancy, dichorionic/diamniotic, first trimester: Secondary | ICD-10-CM

## 2023-07-16 ENCOUNTER — Other Ambulatory Visit: Payer: Self-pay

## 2023-07-16 ENCOUNTER — Emergency Department: Payer: Medicaid Other

## 2023-07-16 ENCOUNTER — Emergency Department
Admission: EM | Admit: 2023-07-16 | Discharge: 2023-07-16 | Disposition: A | Discharge status: Home / Self Care | Payer: Medicaid Other | Attending: Emergency Medicine | Admitting: Emergency Medicine

## 2023-07-16 DIAGNOSIS — Z3A13 13 weeks gestation of pregnancy: Secondary | ICD-10-CM | POA: Insufficient documentation

## 2023-07-16 DIAGNOSIS — O30001 Twin pregnancy, unspecified number of placenta and unspecified number of amniotic sacs, first trimester: Secondary | ICD-10-CM | POA: Diagnosis not present

## 2023-07-16 DIAGNOSIS — Z3491 Encounter for supervision of normal pregnancy, unspecified, first trimester: Secondary | ICD-10-CM | POA: Diagnosis present

## 2023-07-16 DIAGNOSIS — Z139 Encounter for screening, unspecified: Secondary | ICD-10-CM

## 2023-07-16 NOTE — ED Provider Notes (Signed)
[facility] Provider Note    Event Date/Time   First MD Initiated Contact with Patient 07/16/23 1613     (approximate)   History   No chief complaint on file.   HPI  Judith Henson is a 22 y.o. female   Past medical history of gestation at 35 weeks who presents emerged apartment at recommendation of her obstetrics/gynecology clinic at [facility] for screening ultrasound.  They were able to see 1 baby heart rate but unable to see the other today during routine visit.  Their ultrasound technologist was not in due to holidays.  She was sent to the emergency department for screening ultrasound, given high risk pregnancy.  She is completely asymptomatic aside from her baseline nausea.  She has no vaginal bleeding, abdominal pain.  She had another pregnancy uncomplicated earlier this year.  She has no other medical problems.  Independent Historian contributed to assessment above: I spoke with Donato Schultz of [facility] gynecology regarding care plan for this patient, past medical history as well as history of present illness as above       Physical Exam   Triage Vital Signs: ED Triage Vitals  Encounter Vitals Group     BP 07/16/23 1612 114/66     Systolic BP Percentile --      Diastolic BP Percentile --      Pulse Rate 07/16/23 1612 72     Resp 07/16/23 1612 20     Temp 07/16/23 1612 98 F (36.7 C)     Temp Source 07/16/23 1621 Oral     SpO2 07/16/23 1612 98 %     Weight --      Height --      Head Circumference --      Peak Flow --      Pain Score 07/16/23 1611 0     Pain Loc --      Pain Education --      Exclude from Growth Chart --     Most recent vital signs: Vitals:   07/16/23 1612 07/16/23 1621  BP: 114/66 (!) 114/58  Pulse: 72 86  Resp: 20 19  Temp: 98 F (36.7 C) 98 F (36.7 C)  SpO2: 98% 97%    General: Awake, no distress.  CV:  Good peripheral perfusion.  Resp:  Normal effort.  Abd:  No distention.   Other:  Pleasant woman in no acute distress.  Vital signs normal.  Blood pressure 114/60.  Soft benign abdominal exam.  Gravid abdomen   ED Results / Procedures / Treatments   Labs (all labs ordered are listed, but only abnormal results are displayed) Labs Reviewed - No data to display   RADIOLOGY I independently reviewed and interpreted ultrasound of the pelvis and I see single live intrauterine pregnancy I also reviewed radiologist's formal read.   PROCEDURES:  Critical Care performed: No  Procedures   MEDICATIONS ORDERED IN ED: Medications - No data to display  External physician / consultants:  I spoke with Donato Schultz of gynecology regarding care plan for this patient.   IMPRESSION / MDM / ASSESSMENT AND PLAN / ED COURSE  I reviewed the triage vital signs and the nursing notes.                                Patient's presentation is most consistent with acute presentation with potential threat to life or bodily function.  Differential diagnosis includes, but is not limited to, fetal demise, pregnancy complications   The patient is on the cardiac monitor to evaluate for evidence of arrhythmia and/or significant heart rate changes.  MDM:    High risk pregnancy with twin gestation here for screening ultrasound, consultation with gynecology recommends ultrasound transabdominally today.  Patient completely asymptomatic.  Will follow-up with gynecology after the [place]; will relay results w her clinic  -- Unfortunately ultrasound sees 1 viable living intrauterine pregnancy but does not identify the second fetus.  I informed her gynecology team of these findings as well as the patient and offered sympathy and reassurance as well as guidance regarding close follow-up with gynecology as scheduled.  She understood these instructions and will be discharged at this time.       FINAL CLINICAL IMPRESSION(S) / ED DIAGNOSES   Final diagnoses:  Encounter for medical  screening examination  Twin gestation in first trimester, unspecified multiple gestation type     Rx / DC Orders   ED Discharge Orders     None        Note:  This document was prepared using Dragon voice recognition software and may include unintentional dictation errors.    Pilar Jarvis, MD 07/16/23 [phone]

## 2023-07-16 NOTE — ED Triage Notes (Addendum)
Pt to ED via POV from [facility]. Pt is G2P1 and currently [redacted]wks pregnant with twins. Pt at routine visit when OB unable to find baby HR with doppler. [place] tech not in office and sent to ED.

## 2023-07-16 NOTE — ED Triage Notes (Signed)
Pt sent from [facility] OB by NP, pt 13 weeks preg with twins and they are unable to find both baby HR's on doppler, sent for [place] to verify both Heart beats.

## 2023-07-16 NOTE — Discharge Instructions (Addendum)
   Please follow-up with your gynecologist this week for an appointment.   Thank you for choosing [place] for your health care today!  Please see your primary doctor this week for a follow up appointment.   If you have any new, worsening, or unexpected symptoms call your doctor right away or come back to the emergency department for reevaluation.  It was my pleasure to care for you today.   Daneil Dan Modesto Charon, MD

## 2023-07-25 NOTE — L&D Delivery Note (Signed)
 Delivery Note  Judith Henson is a G2P1001 at [redacted]w[redacted]d, Patient's last menstrual period was 02/22/2023 (exact date)., not consistent with US  at [redacted]w[redacted]d. Estimated Date of Delivery: 01/18/24   First Stage: Labor onset: 1330 on 01/13/2024 Augmentation: oxytocin  and AROM Analgesia Holli intrapartum: IVPM with fentanyl  AROM at 1500 for meconium stained amniotic fluid  GBS: negative  Second Stage: Complete dilation at 1605 Onset of pushing at 1605 FHR second stage 135 bpm with moderate variability, variable decels with pushing   Judith Henson presented to L&D with normal labor. She was initially expectantly managed. Oxytocin  was started for augmentation d/t protracted active phase and high fetal station preventing AROM. A variety of position changes was used to help with fetal descent. AROM was then performed after minimal cervical change for more than 4 hours despite frequent contractions. She progressed to C/C/+3 with a strong urge to push.  She pushed quickly over approximately 10 minutes for a spontaneous vaginal birth.  Delivery of a viable baby boy on 01/14/2024 at 1612 by CNM Delivery of fetal head in OA position with restitution to ROT. No nuchal cord;  Anterior then posterior shoulders delivered easily with gentle downward traction. Baby placed on mom's chest, and attended to by baby RN Cord double clamped after cessation of pulsation, cut by father of baby  Cord blood sample collection: Not Indicated A POS  Third Stage: Oxytocin  bolus started after delivery of infant for hemorrhage prophylaxis  Velamentous cord insertion noted. Cord and membranes delivered but placenta remained partially delivered through cervix. IV fentanyl  given or pain management. Placenta easily manually delivered through cervix, intact at 1633.  Manual sweep of uterus performed to assess for retained membranes and products of conception. Multiple clots removed from lower uterine segment. Bleeding  decreased to small after manual sweep performed.   Placenta disposition: discarded.  Uterine tone firm / bleeding small after clots expressed   Laceration: 1st degree right periurethral - hemostatic, no repair  Anesthesia for repair: N/A Suture: N/A Est. Blood Loss (mL): 500 ml   Complications: Manual removal of placenta d/t cord avulsion with velamentous cord insertion   Mom to postpartum.  Baby to Couplet care / Skin to Skin.  Newborn: Information for the patient's newborn:  Daleigh, Pollinger [[id number]]  Live born female  Birth Weight:   APGAR: ,   Newborn Delivery   Birth date/time: 01/14/2024 16:12:00 Delivery type: Vaginal, Spontaneous      Feeding planned: formula feeding  ---------- Judith Henson, CNM Certified Nurse Midwife [facility]

## 2023-08-11 DIAGNOSIS — O3110X0 Continuing pregnancy after spontaneous abortion of one fetus or more, unspecified trimester, not applicable or unspecified: Secondary | ICD-10-CM | POA: Diagnosis present

## 2023-08-11 DIAGNOSIS — O09299 Supervision of pregnancy with other poor reproductive or obstetric history, unspecified trimester: Secondary | ICD-10-CM

## 2023-08-23 ENCOUNTER — Ambulatory Visit: Payer: No Typology Code available for payment source

## 2023-08-23 DIAGNOSIS — O30049 Twin pregnancy, dichorionic/diamniotic, unspecified trimester: Secondary | ICD-10-CM | POA: Insufficient documentation

## 2023-08-23 DIAGNOSIS — O09899 Supervision of other high risk pregnancies, unspecified trimester: Secondary | ICD-10-CM | POA: Insufficient documentation

## 2023-08-31 ENCOUNTER — Other Ambulatory Visit: Payer: Medicaid Other

## 2023-08-31 ENCOUNTER — Ambulatory Visit: Payer: No Typology Code available for payment source | Attending: Certified Nurse Midwife

## 2023-08-31 DIAGNOSIS — O09899 Supervision of other high risk pregnancies, unspecified trimester: Secondary | ICD-10-CM

## 2023-08-31 DIAGNOSIS — O30042 Twin pregnancy, dichorionic/diamniotic, second trimester: Secondary | ICD-10-CM

## 2023-10-31 LAB — OB RESULTS CONSOLE HIV ANTIBODY (ROUTINE TESTING): HIV: NONREACTIVE

## 2023-10-31 LAB — OB RESULTS CONSOLE RPR: RPR: NONREACTIVE

## 2023-12-19 LAB — OB RESULTS CONSOLE GC/CHLAMYDIA
Chlamydia: NEGATIVE
Neisseria Gonorrhea: NEGATIVE

## 2023-12-19 LAB — OB RESULTS CONSOLE GBS: GBS: NEGATIVE

## 2024-01-13 ENCOUNTER — Inpatient Hospital Stay
Admission: EM | Admit: 2024-01-13 | Discharge: 2024-01-16 | DRG: 807 | Disposition: A | Discharge status: Home / Self Care | Attending: Obstetrics | Admitting: Obstetrics

## 2024-01-13 ENCOUNTER — Other Ambulatory Visit: Payer: Self-pay

## 2024-01-13 ENCOUNTER — Encounter: Payer: Self-pay | Admitting: Obstetrics and Gynecology

## 2024-01-13 DIAGNOSIS — O09899 Supervision of other high risk pregnancies, unspecified trimester: Secondary | ICD-10-CM

## 2024-01-13 DIAGNOSIS — Z8659 Personal history of other mental and behavioral disorders: Secondary | ICD-10-CM

## 2024-01-13 DIAGNOSIS — O09299 Supervision of pregnancy with other poor reproductive or obstetric history, unspecified trimester: Secondary | ICD-10-CM

## 2024-01-13 DIAGNOSIS — O43129 Velamentous insertion of umbilical cord, unspecified trimester: Secondary | ICD-10-CM | POA: Diagnosis present

## 2024-01-13 DIAGNOSIS — D509 Iron deficiency anemia, unspecified: Secondary | ICD-10-CM | POA: Diagnosis present

## 2024-01-13 DIAGNOSIS — O3110X0 Continuing pregnancy after spontaneous abortion of one fetus or more, unspecified trimester, not applicable or unspecified: Secondary | ICD-10-CM | POA: Diagnosis present

## 2024-01-13 DIAGNOSIS — O99019 Anemia complicating pregnancy, unspecified trimester: Secondary | ICD-10-CM | POA: Diagnosis present

## 2024-01-13 DIAGNOSIS — Z3A39 39 weeks gestation of pregnancy: Secondary | ICD-10-CM

## 2024-01-13 DIAGNOSIS — O43123 Velamentous insertion of umbilical cord, third trimester: Principal | ICD-10-CM | POA: Diagnosis present

## 2024-01-13 DIAGNOSIS — Z87891 Personal history of nicotine dependence: Secondary | ICD-10-CM

## 2024-01-13 NOTE — OB Triage Note (Signed)
 Judith Henson 23 y.o. @[redacted]w[redacted]d  G2P1 presents to Labor & Delivery triage via wheelchair steered by ED staff reporting contractions. She states that they started around 1300 and became much more intense around 1700. (8/10 pain) Since arriving in triage, she feels that they have slowed down some but reports they were about 5 mins apart. She denies signs and symptoms consistent with rupture of membranes or active vaginal bleeding. She states positive fetal movement. External FM and TOCO applied to non-tender abdomen. Initial FHR 150. Vital signs obtained and within normal limits. Patient oriented to care environment including call bell and bed control use. Wilson, CNM notified of patient's arrival. Plan to labor eval.  Quantarius Genrich L. [name], RN BSN 01/13/2024 11:36 PM

## 2024-01-14 ENCOUNTER — Encounter: Payer: Self-pay | Admitting: Obstetrics and Gynecology

## 2024-01-14 DIAGNOSIS — Z87891 Personal history of nicotine dependence: Secondary | ICD-10-CM | POA: Diagnosis not present

## 2024-01-14 DIAGNOSIS — O43123 Velamentous insertion of umbilical cord, third trimester: Secondary | ICD-10-CM | POA: Diagnosis present

## 2024-01-14 DIAGNOSIS — Z3A39 39 weeks gestation of pregnancy: Secondary | ICD-10-CM | POA: Diagnosis not present

## 2024-01-14 DIAGNOSIS — O43129 Velamentous insertion of umbilical cord, unspecified trimester: Secondary | ICD-10-CM | POA: Diagnosis present

## 2024-01-14 DIAGNOSIS — O99019 Anemia complicating pregnancy, unspecified trimester: Secondary | ICD-10-CM | POA: Diagnosis present

## 2024-01-14 DIAGNOSIS — D509 Iron deficiency anemia, unspecified: Secondary | ICD-10-CM | POA: Diagnosis present

## 2024-01-14 DIAGNOSIS — O26893 Other specified pregnancy related conditions, third trimester: Secondary | ICD-10-CM | POA: Diagnosis present

## 2024-01-14 LAB — TYPE AND SCREEN
ABO/RH(D): A POS
Antibody Screen: NEGATIVE

## 2024-01-14 LAB — CBC
HCT: 32.8 % — ABNORMAL LOW (ref 36.0–46.0)
Hemoglobin: 10.7 g/dL — ABNORMAL LOW (ref 12.0–15.0)
MCH: 28.8 pg (ref 26.0–34.0)
MCHC: 32.6 g/dL (ref 30.0–36.0)
MCV: 88.4 fL (ref 80.0–100.0)
Platelets: 178 10*3/uL (ref 150–400)
RBC: 3.71 MIL/uL — ABNORMAL LOW (ref 3.87–5.11)
RDW: 14.5 % (ref 11.5–15.5)
WBC: 6.3 10*3/uL (ref 4.0–10.5)
nRBC: 0 % (ref 0.0–0.2)

## 2024-01-14 LAB — RPR: RPR Ser Ql: NONREACTIVE

## 2024-01-14 MED ORDER — ONDANSETRON HCL 4 MG/2ML IJ SOLN: 4.0000 mg | Freq: Four times a day (QID) | INTRAMUSCULAR | Status: DC | PRN

## 2024-01-14 MED ORDER — LIDOCAINE HCL (PF) 1 % IJ SOLN
INTRAMUSCULAR | Status: AC
  Filled 2024-01-14: qty 30

## 2024-01-14 MED ORDER — FENTANYL CITRATE (PF) 100 MCG/2ML IJ SOLN
50.0000 ug | INTRAMUSCULAR | Status: DC | PRN
  Administered 2024-01-14 (×2): 100 ug via INTRAVENOUS
  Filled 2024-01-14 (×2): qty 2

## 2024-01-14 MED ORDER — SOD CITRATE-CITRIC ACID 500-334 MG/5ML PO SOLN: 30.0000 mL | ORAL | Status: DC | PRN

## 2024-01-14 MED ORDER — DIBUCAINE (PERIANAL) 1 % EX OINT
1.0000 | TOPICAL_OINTMENT | CUTANEOUS | Status: DC | PRN
  Filled 2024-01-14: qty 28

## 2024-01-14 MED ORDER — OXYTOCIN-SODIUM CHLORIDE 30-0.9 UT/500ML-% IV SOLN
INTRAVENOUS | Status: AC
  Filled 2024-01-14: qty 500

## 2024-01-14 MED ORDER — LIDOCAINE HCL (PF) 1 % IJ SOLN: 30.0000 mL | INTRAMUSCULAR | Status: DC | PRN

## 2024-01-14 MED ORDER — ONDANSETRON HCL 4 MG PO TABS: 4.0000 mg | ORAL_TABLET | ORAL | Status: DC | PRN

## 2024-01-14 MED ORDER — WITCH HAZEL-GLYCERIN EX PADS
1.0000 | MEDICATED_PAD | CUTANEOUS | Status: DC | PRN
  Filled 2024-01-14: qty 100

## 2024-01-14 MED ORDER — ZOLPIDEM TARTRATE 5 MG PO TABS: 5.0000 mg | ORAL_TABLET | Freq: Every evening | ORAL | Status: DC | PRN

## 2024-01-14 MED ORDER — PRENATAL MULTIVITAMIN CH
1.0000 | ORAL_TABLET | Freq: Every day | ORAL | Status: DC
  Administered 2024-01-15 – 2024-01-16 (×2): 1 via ORAL
  Filled 2024-01-14 (×2): qty 1

## 2024-01-14 MED ORDER — MISOPROSTOL 200 MCG PO TABS
ORAL_TABLET | ORAL | Status: AC
  Filled 2024-01-14: qty 4

## 2024-01-14 MED ORDER — TERBUTALINE SULFATE 1 MG/ML IJ SOLN: 0.2500 mg | Freq: Once | INTRAMUSCULAR | Status: DC | PRN

## 2024-01-14 MED ORDER — OXYTOCIN BOLUS FROM INFUSION
333.0000 mL | Freq: Once | INTRAVENOUS | Status: AC
  Administered 2024-01-14: 333 mL via INTRAVENOUS

## 2024-01-14 MED ORDER — BENZOCAINE-MENTHOL 20-0.5 % EX AERO
1.0000 | INHALATION_SPRAY | CUTANEOUS | Status: DC | PRN
  Filled 2024-01-14: qty 56

## 2024-01-14 MED ORDER — OXYTOCIN-SODIUM CHLORIDE 30-0.9 UT/500ML-% IV SOLN: 2.5000 [IU]/h | INTRAVENOUS | Status: DC

## 2024-01-14 MED ORDER — OXYTOCIN-SODIUM CHLORIDE 30-0.9 UT/500ML-% IV SOLN
1.0000 m[IU]/min | INTRAVENOUS | Status: DC
  Administered 2024-01-14: 2 m[IU]/min via INTRAVENOUS

## 2024-01-14 MED ORDER — CALCIUM CARBONATE ANTACID 500 MG PO CHEW: 400.0000 mg | CHEWABLE_TABLET | Freq: Three times a day (TID) | ORAL | Status: DC | PRN

## 2024-01-14 MED ORDER — LACTATED RINGERS IV SOLN: 500.0000 mL | INTRAVENOUS | Status: DC | PRN

## 2024-01-14 MED ORDER — IBUPROFEN 600 MG PO TABS
600.0000 mg | ORAL_TABLET | Freq: Four times a day (QID) | ORAL | Status: DC
  Administered 2024-01-15 – 2024-01-16 (×7): 600 mg via ORAL
  Filled 2024-01-14 (×7): qty 1

## 2024-01-14 MED ORDER — FERROUS SULFATE 325 (65 FE) MG PO TABS
325.0000 mg | ORAL_TABLET | Freq: Two times a day (BID) | ORAL | Status: DC
  Administered 2024-01-15 – 2024-01-16 (×3): 325 mg via ORAL
  Filled 2024-01-14 (×3): qty 1

## 2024-01-14 MED ORDER — OXYTOCIN 10 UNIT/ML IJ SOLN
INTRAMUSCULAR | Status: AC
  Filled 2024-01-14: qty 2

## 2024-01-14 MED ORDER — FAMOTIDINE 20 MG PO TABS: 20.0000 mg | ORAL_TABLET | Freq: Two times a day (BID) | ORAL | Status: DC | PRN

## 2024-01-14 MED ORDER — DIPHENHYDRAMINE HCL 25 MG PO CAPS: 25.0000 mg | ORAL_CAPSULE | Freq: Four times a day (QID) | ORAL | Status: DC | PRN

## 2024-01-14 MED ORDER — SIMETHICONE 80 MG PO CHEW: 80.0000 mg | CHEWABLE_TABLET | ORAL | Status: DC | PRN

## 2024-01-14 MED ORDER — SENNOSIDES-DOCUSATE SODIUM 8.6-50 MG PO TABS
2.0000 | ORAL_TABLET | Freq: Every day | ORAL | Status: DC
  Administered 2024-01-15 – 2024-01-16 (×2): 2 via ORAL
  Filled 2024-01-14 (×2): qty 2

## 2024-01-14 MED ORDER — ACETAMINOPHEN 325 MG PO TABS: 650.0000 mg | ORAL_TABLET | ORAL | Status: DC | PRN

## 2024-01-14 MED ORDER — AMMONIA AROMATIC IN INHA
RESPIRATORY_TRACT | Status: AC
  Filled 2024-01-14: qty 10

## 2024-01-14 MED ORDER — ONDANSETRON HCL 4 MG/2ML IJ SOLN: 4.0000 mg | INTRAMUSCULAR | Status: DC | PRN

## 2024-01-14 MED ORDER — COCONUT OIL OIL: 1.0000 | TOPICAL_OIL | Status: DC | PRN

## 2024-01-14 MED ORDER — LACTATED RINGERS IV SOLN: INTRAVENOUS | Status: DC

## 2024-01-14 MED ORDER — CEFAZOLIN SODIUM-DEXTROSE 2-4 GM/100ML-% IV SOLN
2.0000 g | Freq: Once | INTRAVENOUS | Status: AC
  Administered 2024-01-14: 2 g via INTRAVENOUS
  Filled 2024-01-14: qty 100

## 2024-01-14 MED ORDER — ACETAMINOPHEN 500 MG PO TABS
1000.0000 mg | ORAL_TABLET | Freq: Four times a day (QID) | ORAL | Status: DC
  Administered 2024-01-15 – 2024-01-16 (×7): 1000 mg via ORAL
  Filled 2024-01-14 (×7): qty 2

## 2024-01-14 MED ORDER — CALCIUM CARBONATE ANTACID 500 MG PO CHEW
CHEWABLE_TABLET | ORAL | Status: AC
  Filled 2024-01-14: qty 2

## 2024-01-14 NOTE — Progress Notes (Signed)
 L&D Note    Subjective:  Contractions are feeling more uncomfortable. Tolerating well.   Objective:    Current Vital Signs 24h Vital Sign Ranges  T (!) 97.4 F (36.3 C) Temp  Avg: 98.1 F (36.7 C)  Min: 97.4 F (36.3 C)  Max: 98.4 F (36.9 C)  BP 102/76 BP  Min: 102/76  Max: 129/76  HR 92 Pulse  Avg: 89.5  Min: 87  Max: 92  RR 16 Resp  Avg: 15.8  Min: 15  Max: 16  SaO2     No data recorded      Gen: alert, cooperative, no distress FHR: Baseline: 130 bpm, Variability: moderate, Accels: Present, Decels: occasional variables Toco: regular, every 2-4 minutes SVE: Dilation: 6.5 Effacement (%): 80 Cervical Position: Middle Station: -2 Presentation: Vertex Exam by:: Henson, CNM  Medications SCHEDULED MEDICATIONS   ammonia       lidocaine  (PF)       misoprostol       oxytocin        oxytocin  40 units in LR 1000 mL  333 mL Intravenous Once   Oxytocin -Sodium Chloride         MEDICATION INFUSIONS   lactated ringers      lactated ringers  125 mL/hr at 01/14/24 1005   oxytocin      oxytocin  6 milli-units/min (01/14/24 1236)    PRN MEDICATIONS  acetaminophen , ammonia, fentaNYL  (SUBLIMAZE ) injection, lactated ringers , lidocaine  (PF), lidocaine  (PF), misoprostol, ondansetron , oxytocin , Oxytocin -Sodium Chloride , sodium citrate -citric acid , terbutaline    Assessment & Plan:  23 y.o. G2P1001 at [redacted]w[redacted]d admitted for labor  -Labor: Active phase labor., Adequate uterine activity - intensity and frequency., and Prolonged active phase labor. Oxytocin  infusing at 4 milliunits/min, increased to 6 milliunits/min.  -Fetal Well-being: Category II-> I -GBS: negative -Membranes intact - Will consider AROM at lower station if indicated  -Continue present management. -Analgesia: unmedicated labor support options  -Dr. Verdon updated on progress   Judith Henson, CNM  01/14/2024 12:40 PM  [facility]

## 2024-01-14 NOTE — Progress Notes (Signed)
 Labor Progress Note  Geeta Alexus Helane Briceno is a 23 y.o. G2P1001 at [redacted]w[redacted]d by ultrasound admitted for active labor  Subjective: she reports her contractions are stronger, 9/10 pain  Objective: BP 129/76 (BP Location: Left Arm)   Pulse 87   Temp 98.4 F (36.9 C) (Oral)   Resp 16   Ht 5' 5 (1.651 m)   Wt 86.2 kg   BMI 31.62 kg/m  Notable VS details: reviewed  Fetal Assessment: FHT:  FHR: 125 bpm, variability: moderate,  accelerations:  Present,  decelerations:  Absent Category/reactivity:  Category I UC:   irregular, every 5-7 minutes SVE:    Dilation: 7cm  Effacement: 80%  Station:  -2  Consistency: soft  Position: middle  Membrane status:intact Amniotic color: n/a  Labs: Lab Results  Component Value Date   WBC 6.3 01/14/2024   HGB 10.7 (L) 01/14/2024   HCT 32.8 (L) 01/14/2024   MCV 88.4 01/14/2024   PLT 178 01/14/2024    Assessment / Plan: 23 year old G2P1001 at [redacted]w[redacted]d with active labor  Labor: Good labor progress. Discussed AROM and she declines at this time. No need for oxytocin  augmentation at this time. Preeclampsia:  BP 129/76 Fetal Wellbeing:  Category I Pain Control:  Labor support without medications I/D:  GBS negative Anticipated MOD:  NSVD  Edsel Charlies Blush, CNM 01/14/2024, 5:22 AM

## 2024-01-14 NOTE — Progress Notes (Signed)
 L&D Note    Subjective:  Reports contractions are feeling stronger, breathing well with them  Objective:    Current Vital Signs 24h Vital Sign Ranges  T 98.4 F (36.9 C) Temp  Avg: 98.3 F (36.8 C)  Min: 98.1 F (36.7 C)  Max: 98.4 F (36.9 C)  BP 117/60 BP  Min: 115/74  Max: 129/76  HR 87 Pulse  Avg: 88.7  Min: 87  Max: 92  RR 16 Resp  Avg: 15.7  Min: 15  Max: 16  SaO2     No data recorded      Gen: alert, cooperative, no distress FHR: Baseline: 135 bpm, Variability: moderate, Accels: Present, Decels: none Toco: regular, every 3-5 minutes SVE: Dilation: 7 Effacement (%): 80 Station: -2 Presentation: Vertex Exam by:: Tanda, CNM  Medications SCHEDULED MEDICATIONS   ammonia       lidocaine  (PF)       misoprostol       oxytocin        oxytocin  40 units in LR 1000 mL  333 mL Intravenous Once   Oxytocin -Sodium Chloride         MEDICATION INFUSIONS   lactated ringers      lactated ringers      oxytocin       PRN MEDICATIONS  acetaminophen , ammonia, fentaNYL  (SUBLIMAZE ) injection, lactated ringers , lidocaine  (PF), lidocaine  (PF), misoprostol, ondansetron , oxytocin , Oxytocin -Sodium Chloride , sodium citrate -citric acid    Assessment & Plan:  23 y.o. G2P1001 at 100w3d admitted for active labor  -Labor: Active phase of labor, tolerating contractions well, Adequate uterine activity - intensity and frequency. -Fetal Well-being: Category I -> will change to intermittent mnitoring.  -GBS: negative -Membranes intact - declines AROM for now -Continue present management. Encouraged position changes and PO hydration. Will utilize position changes to help facilitate fetal descent.  -Analgesia: position changes , birth ball, and unmedicated labor support options    Judith Henson, [place]  01/14/2024 8:15 AM  Judith Henson

## 2024-01-14 NOTE — H&P (Signed)
 OB History & Physical   History of Present Illness:  Chief Complaint:   HPI:  Judith Henson is a 23 y.o. G58P1001 female at [redacted]w[redacted]d dated by 5wk US .  She presents to L&D for painful uterine contractions since 1:30pm. The contractions are now strong. She denies leaking fluid but endorses a small amount of vaginal spotting.   Pregnancy Issues: 1. Vanishing twin syndrome 2. Short interval pregnancy 3. Low-lying placenta (resolved) 4. Hx of shoulder dystocia with first baby (lasted 20 seconds) 5. Anemia 6. Anxiety and depression   Maternal Medical History:   Past Medical History:  Diagnosis Date   Asthma    Depression     Past Surgical History:  Procedure Laterality Date   left wrist surgery      No Known Allergies  Prior to Admission medications   Medication Sig Start Date End Date Taking? Authorizing Provider  ferrous sulfate  325 (65 FE) MG EC tablet Take 325 mg by mouth 3 (three) times daily with meals.   Yes [provider]  Prenatal MV & Min w/FA-DHA (PRENATAL GUMMIES PO) Take by mouth.   Yes [provider]  acetaminophen  (TYLENOL ) 500 MG tablet Take 2 tablets (1,000 mg total) by mouth every 6 (six) hours. 11/08/22   Vernel Therisa HERO, CNM  albuterol  (VENTOLIN  HFA) 108 [phone] Base) MCG/ACT inhaler Inhale 2 puffs into the lungs every 6 (six) hours as needed for wheezing or shortness of breath. 05/14/21   Paduchowski, Kevin, MD  ibuprofen  (ADVIL ) 600 MG tablet Take 1 tablet (600 mg total) by mouth every 6 (six) hours as needed for mild pain or cramping. 11/08/22   Vernel Therisa HERO, CNM    Prenatal care site: [facility]   Social History: She  reports that she quit smoking about 16 months ago. Her smoking use included cigarettes. She has never used smokeless tobacco. She reports that she does not drink alcohol and does not use drugs.  Family History: family history is not on file.   Review of Systems: A full review of systems was  performed and negative except as noted in the HPI.     Physical Exam:  Vital Signs: BP 115/74 (BP Location: Right Arm)   Pulse 92   Temp 98.1 F (36.7 C) (Oral)   Resp 15   Ht 5' 5 (1.651 m)   Wt 86.2 kg   BMI 31.62 kg/m  General: no acute distress.  HEENT: normocephalic, atraumatic Heart: regular rate & rhythm.  No murmurs/rubs/gallops Lungs: clear to auscultation bilaterally, normal respiratory effort Abdomen: soft, gravid, non-tender;  EFW: 7lb Pelvic:   External: Normal external female genitalia  Cervix: Dilation: 4.5 / Effacement (%): 70 / Station: -3    Extremities: non-tender, symmetric, mild edema bilaterally.  DTRs: +2  Neurologic: Alert & oriented x 3.    No results found for this or any previous visit (from the past 24 hours).  Pertinent Results:  Prenatal Labs: Blood type/Rh A+  Antibody screen neg  Rubella Immune  Varicella Immune  RPR NR  HBsAg Neg  HIV NR  GC neg  Chlamydia neg  Genetic screening negative  1 hour GTT 115  3 hour GTT   GBS Negative   FHT: 155bpm, moderate variability, accelerations present, no decelerations TOCO: contractions q3-59min SVE:  Dilation: 4.5 / Effacement (%): 70 / Station: -3    Cephalic by leopolds  No results found.  Assessment:  Judith Henson is a 23 y.o. G52P1001 female at  [redacted]w[redacted]d with uterine contractions.   Plan:  1. Admit to Labor & Delivery; consents reviewed and obtained - Dr. Leonce notified of admission and plan of care  2. Fetal Well being  - Fetal Tracing: Category I tracing - Group B Streptococcus ppx indicated: n/a, GBS negative - Presentation: vertex confirmed by SVE   3. Routine OB: - Prenatal labs reviewed, as above - Rh positive - CBC, T&S, RPR on admit - Clear fluids, saline lock  4. Monitoring of Labor -  Contractions q3-60min, external toco in place -  Pelvis proven to 3190g -  Plan for augmentation with AROM and/or oxytocin  as needed -  Plan for continuous fetal  monitoring  -  Maternal pain control as desired; requesting unmedicated comfort measures - Anticipate vaginal delivery  5. Post Partum Planning: - Infant feeding: formula - Contraception: IUD (undecided Paragard vs Mirena) - Tdap: declined - Flu: received fall 2024  7. Hx of shoulder dystocia: - 20 seconds, resolved with McRoberts and suprapubic pressure on a 3190g infant - In the pregnancy received discussion about vaginal birth vs primary cesarean section and she would like a vaginal birth.  8. Anxiety and depression: - no medications - Plan 2 week postpartum mood check  Edsel Charlies Blush, CNM 01/14/24 2:21 AM

## 2024-01-14 NOTE — Progress Notes (Signed)
 L&D Note    Subjective:  Breathing well with contractions   Objective:    Current Vital Signs 24h Vital Sign Ranges  T 98.4 F (36.9 C) Temp  Avg: 98.3 F (36.8 C)  Min: 98.1 F (36.7 C)  Max: 98.4 F (36.9 C)  BP 117/60 BP  Min: 115/74  Max: 129/76  HR 87 Pulse  Avg: 88.7  Min: 87  Max: 92  RR 16 Resp  Avg: 15.7  Min: 15  Max: 16  SaO2     No data recorded      Gen: alert, cooperative, no distress FHR: Baseline: 140 bpm, Variability: moderate, Accels: Present, Decels: none Toco: regular, every 3-7 minutes SVE: Dilation: 5.5 Effacement (%): 70 Station: -2 Presentation: Vertex Exam by:: Vernel, CNM  Medications SCHEDULED MEDICATIONS   ammonia       lidocaine  (PF)       misoprostol       oxytocin        oxytocin  40 units in LR 1000 mL  333 mL Intravenous Once   Oxytocin -Sodium Chloride         MEDICATION INFUSIONS   lactated ringers      lactated ringers      oxytocin      oxytocin       PRN MEDICATIONS  acetaminophen , ammonia, fentaNYL  (SUBLIMAZE ) injection, lactated ringers , lidocaine  (PF), lidocaine  (PF), misoprostol, ondansetron , oxytocin , Oxytocin -Sodium Chloride , sodium citrate -citric acid , terbutaline    Assessment & Plan:  23 y.o. G2P1001 at [redacted]w[redacted]d admitted for active labor  -Labor: Prolonged active phase labor. Cervical exam unchanged and now feels closer to 5-6 cm. Fetal station -3 and ballotable. Cephalic presentation confirmed with bedside US . Discussed risk/benefits of augmentation with oxytocin . Saralee consents to starting oxytocin .  -Fetal Well-being: Category I -GBS: negative -Membranes intact - Will consider AROM at lower station if indicated  -Intervention: IV Pitocin  augmentation -Analgesia: unmedicated labor support options  -Dr. Verdon updated on assessment and interventions    Therisa CHRISTELLA Vernel, CNM  01/14/2024 10:05 AM  [facility]

## 2024-01-14 NOTE — Discharge Instructions (Signed)
 Discharge Instructions:   If there are any new medications, they have been ordered and will be available for pickup at the listed pharmacy on your way home from the hospital.   Call office if you have any of the following: headache, visual changes, fever >101.0 F, chills, shortness of breath, breast concerns, excessive vaginal bleeding, incision drainage or problems, leg pain or redness, depression or any other concerns. If you have vaginal discharge with an odor, let your doctor know.   It is normal to bleed for up to 6 weeks. You should not soak through more than 1 pad in 1 hour. If you have a blood clot larger than your fist with continued bleeding, call your doctor.   Activity: Do not lift > 10 lbs for 6 weeks (do not lift anything heavier than your baby). No intercourse, tampons, swimming pools, hot tubs, baths (only showers) for 6 weeks.  No driving for 1-2 weeks. Continue prenatal vitamin, especially if breastfeeding. Increase calories and fluids (water) while breastfeeding.   Your milk will come in, in the next couple of days (right now it is colostrum). You may have a slight fever when your milk comes in, but it should go away on its own.  If it does not, and rises above 101 F please call the doctor. You will also feel achy and your breasts will be firm. They will also start to leak. If you are breastfeeding, continue as you have been and you can pump/express milk for comfort.   If you have too much milk, your breasts can become engorged, which could lead to mastitis. This is an infection of the milk ducts. It can be very painful and you will need to notify your doctor to obtain a prescription for antibiotics. You can also treat it with a shower or hot/cold compress.   For concerns about your baby, please call your pediatrician.  For breastfeeding concerns, the lactation consultant can be reached at [phone].   Postpartum blues (feelings of happy one minute and sad another minute)  are normal for the first few weeks but if it gets worse let your doctor know.   Congratulations! We enjoyed caring for you and your new bundle of joy!

## 2024-01-14 NOTE — Progress Notes (Signed)
 L&D Note    Subjective:  Contractions feel more intense, breathing well with them. Recently received IVPM with fentanyl  and it's helping   Objective:    Current Vital Signs 24h Vital Sign Ranges  T 98.2 F (36.8 C) Temp  Avg: 98.1 F (36.7 C)  Min: 97.4 F (36.3 C)  Max: 98.4 F (36.9 C)  BP 102/76 BP  Min: 102/76  Max: 129/76  HR 92 Pulse  Avg: 89.5  Min: 87  Max: 92  RR 16 Resp  Avg: 15.8  Min: 15  Max: 16  SaO2     No data recorded      Gen: alert, cooperative, no distress FHR: Baseline: 130 bpm, Variability: moderate, Accels: Present, Decels: none Toco: regular, every 3-4 minutes SVE: Dilation: 7 Effacement (%): 70 Cervical Position: Posterior Station: -1, -2 Presentation: Vertex Exam by:: Vernel, CNM  Medications SCHEDULED MEDICATIONS   oxytocin  40 units in LR 1000 mL  333 mL Intravenous Once    MEDICATION INFUSIONS   lactated ringers      lactated ringers  125 mL/hr at 01/14/24 1005   oxytocin      oxytocin  6 milli-units/min (01/14/24 1236)    PRN MEDICATIONS  acetaminophen , fentaNYL  (SUBLIMAZE ) injection, lactated ringers , lidocaine  (PF), ondansetron , sodium citrate -citric acid , terbutaline    Assessment & Plan:  23 y.o. G2P1001 at [redacted]w[redacted]d admitted for labor  -Labor: Adequate uterine activity - intensity and frequency. and Prolonged active phase labor. Oxytocin  infusing at 6 milliunits/min. Discussed risk/benefits of AROM. Catalena consents to AROM for augmentation.  -Fetal Well-being: Category I -GBS: negative -Membranes artificially ruptured, meconium stained fluid at 1500  -Intervention: IV Pitocin  augmentation and AROM -Analgesia: IVPM -Dr. Verdon updated on progress and interventions   Therisa CHRISTELLA Vernel, CNM  01/14/2024 3:05 PM  [facility]

## 2024-01-14 NOTE — Discharge Summary (Signed)
 Postpartum Discharge Summary  Patient Name: Judith Henson DOB: 01-Feb-2001 MRN: [id number]  Date of admission: 01/13/2024 Delivery date:01/14/2024 Delivering provider: MACKIE, ANNA M Date of discharge: 01/16/2024  Primary OB: [facility] OFE:Ejupzwu'd last menstrual period was 02/22/2023 (exact date). EDC Estimated Date of Delivery: 01/18/24 Gestational Age at Delivery: [redacted]w[redacted]d   Admitting diagnosis: Uterine contractions [O47.9] Intrauterine pregnancy: [redacted]w[redacted]d     Secondary diagnosis:   Principal Problem:   Normal labor Active Problems:   History of depression   Short interval between pregnancies affecting pregnancy, antepartum   History of shoulder dystocia in prior pregnancy, currently pregnant   Vanishing twin syndrome   NSVD (normal spontaneous vaginal delivery)   Velamentous insertion of umbilical cord   Maternal iron deficiency anemia affecting pregnancy, antepartum   Discharge Diagnosis: Term Pregnancy Delivered      Hospital course: Onset of Labor With Vaginal Delivery      23 y.o. yo [id number] at [redacted]w[redacted]d was admitted in Active Labor on 01/13/2024. Labor course was complicated by protracted labor.  Judith Henson presented to L&D with normal labor. She was initially expectantly managed. Oxytocin  was started for augmentation d/t protracted active phase and high fetal station preventing AROM. A variety of position changes was used to help with fetal descent. AROM was then performed after minimal cervical change for more than 4 hours despite frequent contractions. She progressed to C/C/+3 with a strong urge to push.  She pushed quickly over approximately 10 minutes for a spontaneous vaginal birth.  Membrane Rupture Time/Date: 3:00 PM,01/14/2024  Delivery Method:Vaginal, Spontaneous Operative Delivery:N/A Episiotomy:   Lacerations:  1st degree;Vaginal Patient had an uncomplicated postpartum course. She is ambulating, tolerating a regular diet, passing flatus, and  urinating well. Patient is discharged home in stable condition on 01/16/24.  Newborn Data: Birth date:01/14/2024 Birth time:4:12 PM Gender:Female Living status:Living Apgars:8 ,9  Weight:3880 g                                            Post partum procedures:none Augmentation:: AROM and Pitocin  Complications: Manual removal of placenta d/t cord avulsion with velamentous cord insertion Delivery Type: spontaneous vaginal delivery Anesthesia: IV narcotics Placenta: manual removal To Pathology: No  Velamentous cord insertion noted. Cord and membranes delivered but placenta remained partially delivered through cervix. IV fentanyl  given or pain management. Placenta easily manually delivered through cervix, intact at 1633.  Manual sweep of uterus performed to assess for retained membranes and products of conception. Multiple clots removed from lower uterine segment. Bleeding decreased to small after manual sweep performed Membrane Rupture Time/Date: 3:00 PM,01/14/2024   Prenatal Labs:  Blood type/Rh A+  Antibody screen neg  Rubella Immune  Varicella Immune  RPR NR  HBsAg Neg  HIV NR  GC neg  Chlamydia neg  Genetic screening negative  1 hour GTT 115  3 hour GTT    GBS Negative    Magnesium Sulfate received: No BMZ received: No Rhophylac:was not indicated MMR: was not indicated Varivax vaccine given: was not indicated - Tdap vaccine: declined  - Flu vaccine: Given prenatally -RSV vaccine: Not in season  Transfusion:No  Physical exam  Vitals:   01/15/24 0850 01/15/24 1725 01/15/24 2300 01/16/24 0840  BP: 123/71 115/72 115/71 125/75  Pulse: 73 80 86 77  Resp: 17 16 18 18   Temp: 97.8 F (36.6 C) 98.5 F (36.9 C) 98.5 F (36.9  C) 98.3 F (36.8 C)  TempSrc: Oral Oral Oral Oral  SpO2: 100% 97% 99% 98%  Weight:      Height:       General: alert, cooperative, and no distress Lochia: appropriate Uterine Fundus: firm Perineum:minimal edema/laceration hemostatic DVT  Evaluation: No evidence of DVT seen on physical exam. Negative Homan's sign. No cords or calf tenderness. No significant calf/ankle edema.  Labs: Lab Results  Component Value Date   WBC 12.9 (H) 01/15/2024   HGB 10.0 (L) 01/15/2024   HCT 31.1 (L) 01/15/2024   MCV 89.1 01/15/2024   PLT 161 01/15/2024      Latest Ref Rng & Units 08/25/2021    2:24 PM  CMP  Glucose 70 - 99 mg/dL 87   BUN 6 - 20 mg/dL 12   Creatinine 9.55 - 1.00 mg/dL 9.26   Sodium 864 - 854 mmol/L 135   Potassium 3.5 - 5.1 mmol/L 3.7   Chloride 98 - 111 mmol/L 105   CO2 22 - 32 mmol/L 25   Calcium  8.9 - 10.3 mg/dL 8.8   Total Protein 6.5 - 8.1 g/dL 7.7   Total Bilirubin 0.3 - 1.2 mg/dL 1.5   Alkaline Phos 38 - 126 U/L 53   AST 15 - 41 U/L 21   ALT 0 - 44 U/L 16    Edinburgh Score:    01/14/2024   11:00 PM  Edinburgh Postnatal Depression Scale Screening Tool  I have been able to laugh and see the funny side of things. 0  I have looked forward with enjoyment to things. 0  I have blamed myself unnecessarily when things went wrong. 1  I have been anxious or worried for no good reason. 1  I have felt scared or panicky for no good reason. 1  Things have been getting on top of me. 0  I have been so unhappy that I have had difficulty sleeping. 0  I have felt sad or miserable. 0  I have been so unhappy that I have been crying. 0  The thought of harming myself has occurred to me. 0  Edinburgh Postnatal Depression Scale Total 3    Risk assessment for postpartum VTE and prophylactic treatment: Very high risk factors: None High risk factors: None Moderate risk factors: BMI 30-40 kg/m2  Postpartum VTE prophylaxis with LMWH not indicated  After visit meds:  Allergies as of 01/16/2024   No Known Allergies      Medication List     STOP taking these medications    ferrous sulfate  325 (65 FE) MG EC tablet Replaced by: ferrous sulfate  325 (65 FE) MG tablet       TAKE these medications    acetaminophen   500 MG tablet Commonly known as: TYLENOL  Take 2 tablets (1,000 mg total) by mouth every 6 (six) hours. What changed: when to take this   albuterol  108 (90 Base) MCG/ACT inhaler Commonly known as: VENTOLIN  HFA Inhale 2 puffs into the lungs every 6 (six) hours as needed for wheezing or shortness of breath.   benzocaine -Menthol  20-0.5 % Aero Commonly known as: DERMOPLAST Apply 1 Application topically as needed for irritation (perineal discomfort).   coconut oil Oil Apply 1 Application topically as needed.   dibucaine 1 % Oint Commonly known as: NUPERCAINAL Place 1 Application rectally as needed for hemorrhoids.   famotidine  20 MG tablet Commonly known as: PEPCID  Take 1 tablet (20 mg total) by mouth every 12 (twelve) hours as needed (Breakthrough heartburn or indigestion).  ferrous sulfate  325 (65 FE) MG tablet Take 1 tablet (325 mg total) by mouth 2 (two) times daily with a meal. Replaces: ferrous sulfate  325 (65 FE) MG EC tablet   ibuprofen  600 MG tablet Commonly known as: ADVIL  Take 1 tablet (600 mg total) by mouth every 6 (six) hours. What changed:  when to take this reasons to take this   PRENATAL GUMMIES PO Take by mouth.   prenatal multivitamin Tabs tablet Take 1 tablet by mouth daily at 12 noon. Start taking on: January 17, 2024   senna-docusate 8.6-50 MG tablet Commonly known as: Senokot-S Take 2 tablets by mouth daily. Start taking on: January 17, 2024   simethicone  80 MG chewable tablet Commonly known as: MYLICON Chew 1 tablet (80 mg total) by mouth as needed for flatulence.   witch hazel-glycerin  pad Commonly known as: TUCKS Apply 1 Application topically as needed for hemorrhoids.       Discharge home in stable condition Infant Feeding: Bottle Infant Disposition:home with mother Discharge instruction: per After Visit Summary and Postpartum booklet. Activity: Advance as tolerated. Pelvic rest for 6 weeks.  Diet: routine diet Anticipated Birth Control:   Contraceptives: IUD Unsure if Paragard versus Liletta/Mirena Postpartum Appointment:6 weeks Additional Postpartum F/U: Postpartum Depression checkup Future Appointments:No future appointments. Follow up Visit:  Follow-up Information     Vernel Therisa HERO, CNM. Schedule an appointment as soon as possible for a visit in 2 week(s).   Specialty: Certified Nurse Midwife Why: postpartum mood check Contact information: [address] [place] [address] [phone]         Vernel Therisa HERO, CNM. Schedule an appointment as soon as possible for a visit in 6 week(s).   Specialty: Certified Nurse Midwife Why: postpartum visit, iud inseriton. Please let us  know if you would prefer the non-hormonal IUD (Paragard) versus the hormonal one Elvan) Contact information: [address] [place] [address] [phone]                 Plan:  Judith Henson was discharged to home in good condition. Follow-up appointment as directed.    Signed: Bobbette Brunswick CNM

## 2024-01-15 LAB — CBC
HCT: 31.1 % — ABNORMAL LOW (ref 36.0–46.0)
Hemoglobin: 10 g/dL — ABNORMAL LOW (ref 12.0–15.0)
MCH: 28.7 pg (ref 26.0–34.0)
MCHC: 32.2 g/dL (ref 30.0–36.0)
MCV: 89.1 fL (ref 80.0–100.0)
Platelets: 161 10*3/uL (ref 150–400)
RBC: 3.49 MIL/uL — ABNORMAL LOW (ref 3.87–5.11)
RDW: 14.4 % (ref 11.5–15.5)
WBC: 12.9 10*3/uL — ABNORMAL HIGH (ref 4.0–10.5)
nRBC: 0 % (ref 0.0–0.2)

## 2024-01-15 NOTE — Progress Notes (Signed)
 Postpartum Day  1  Subjective: 23 y.o. H7E7997 postpartum day #1 status post normal spontaneous vaginal delivery. She is ambulating, is tolerating po, is voiding spontaneously.  Her pain is well controlled on PO pain medications. Her lochia is less than menses.  Objective: BP 113/75 (BP Location: Right Arm)   Pulse 77   Temp 98.5 F (36.9 C) (Oral)   Resp 18   Ht 5' 5 (1.651 m)   Wt 86.2 kg   LMP 02/22/2023 (Exact Date)   SpO2 99%   Breastfeeding Unknown   BMI 31.62 kg/m    Physical Exam:  General: alert, cooperative, and no distress Breasts: soft/nontender Pulm: nl effort Abdomen: soft, non-tender, active bowel sounds Uterine Fundus: firm Perineum: minimal edema, laceration hemostatic Lochia: appropriate DVT Evaluation: No evidence of DVT seen on physical exam.  Recent Labs    01/14/24 0206 01/15/24 0454  HGB 10.7* 10.0*  HCT 32.8* 31.1*  WBC 6.3 12.9*  PLT 178 161    Assessment/Plan: 23 y.o. G2P2002 postpartum day # 1  1. Continue routine postpartum care  2. Infant feeding status: formula feeding -Encouraged snug fitting bra, cold application, Tylenol  PRN, and cabbage leaves for engorgement for formula feeding  -Working with feeding team for infant feeds  3. Contraception plan: IUD - considering Paragard versus Liletta   4. Maternal iron deficiency anemia - clinically not significant .  -Hemodynamically stable and asymptomatic -Hgb 10.7 on admission and decreased to 10.0 -Intervention: continue on oral supplementation with ferrous sulfate  325  5. Immunization status:   all immunizations up to date  Disposition: continue inpatient postpartum care , desires discharge home today pending infant status    LOS: 1 day   Therisa CHRISTELLA Pillow, CNM 01/15/2024, 8:23 AM   ----- Therisa Pillow  Certified Nurse Midwife [facility]

## 2024-01-16 MED ORDER — COCONUT OIL OIL: 1.0000 | TOPICAL_OIL | Status: AC | PRN

## 2024-01-16 MED ORDER — SIMETHICONE 80 MG PO CHEW
80.0000 mg | CHEWABLE_TABLET | ORAL | Status: AC | PRN
Start: 2024-01-16 — End: ?

## 2024-01-16 MED ORDER — WITCH HAZEL-GLYCERIN EX PADS
1.0000 | MEDICATED_PAD | CUTANEOUS | Status: AC | PRN
Start: 2024-01-16 — End: ?

## 2024-01-16 MED ORDER — IBUPROFEN 600 MG PO TABS: 600.0000 mg | ORAL_TABLET | Freq: Four times a day (QID) | ORAL | 0 refills | Status: AC

## 2024-01-16 MED ORDER — BENZOCAINE-MENTHOL 20-0.5 % EX AERO: 1.0000 | INHALATION_SPRAY | CUTANEOUS | Status: AC | PRN

## 2024-01-16 MED ORDER — SENNOSIDES-DOCUSATE SODIUM 8.6-50 MG PO TABS: 2.0000 | ORAL_TABLET | Freq: Every day | ORAL | Status: AC

## 2024-01-16 MED ORDER — FAMOTIDINE 20 MG PO TABS: 20.0000 mg | ORAL_TABLET | Freq: Two times a day (BID) | ORAL | 0 refills | Status: AC | PRN

## 2024-01-16 MED ORDER — PRENATAL MULTIVITAMIN CH: 1.0000 | ORAL_TABLET | Freq: Every day | ORAL | Status: AC

## 2024-01-16 MED ORDER — ACETAMINOPHEN 500 MG PO TABS
1000.0000 mg | ORAL_TABLET | Freq: Four times a day (QID) | ORAL | Status: AC
Start: 2024-01-16 — End: ?

## 2024-01-16 MED ORDER — DIBUCAINE (PERIANAL) 1 % EX OINT
1.0000 | TOPICAL_OINTMENT | CUTANEOUS | Status: AC | PRN
Start: 2024-01-16 — End: ?

## 2024-01-16 MED ORDER — FERROUS SULFATE 325 (65 FE) MG PO TABS: 325.0000 mg | ORAL_TABLET | Freq: Two times a day (BID) | ORAL | Status: AC

## 2024-01-16 NOTE — Progress Notes (Signed)
Patient discharged home with infant. Discharge instructions and prescriptions given and reviewed with patient. Patient verbalized understanding. Escorted out by auxillary.  

## 2024-07-22 ENCOUNTER — Emergency Department
Admission: EM | Admit: 2024-07-22 | Discharge: 2024-07-22 | Disposition: A | Discharge status: Home / Self Care | Attending: Emergency Medicine | Admitting: Emergency Medicine

## 2024-07-22 DIAGNOSIS — R059 Cough, unspecified: Secondary | ICD-10-CM | POA: Diagnosis present

## 2024-07-22 DIAGNOSIS — J069 Acute upper respiratory infection, unspecified: Secondary | ICD-10-CM | POA: Diagnosis not present

## 2024-07-22 LAB — GROUP A STREP BY PCR: Group A Strep by PCR: NOT DETECTED

## 2024-07-22 NOTE — ED Provider Notes (Signed)
 "  [facility] Provider Note    Event Date/Time   First MD Initiated Contact with Patient 07/22/24 1324     (approximate)   History   Sore Throat   HPI  Judith Henson is a 23 y.o. female who presents today for evaluation of sore throat, cough, nasal congestion for the past 3 days.  Patient reports that she does not get a flu shot this year.  She denies chest pain or shortness of breath.  No abdominal pain, nausea, vomiting, diarrhea.  Patient Active Problem List   Diagnosis Date Noted   Normal labor 01/14/2024   NSVD (normal spontaneous vaginal delivery) 01/14/2024   Velamentous insertion of umbilical cord 01/14/2024   Maternal iron deficiency anemia affecting pregnancy, antepartum 01/14/2024   Short interval between pregnancies affecting pregnancy, antepartum 08/23/2023   History of shoulder dystocia in prior pregnancy, currently pregnant 08/11/2023   Vanishing twin syndrome 08/11/2023   Supervision of high risk pregnancy in third trimester 06/13/2023   History of depression 05/26/2022   MDD (major depressive disorder) 06/09/2015   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          Physical Exam   Triage Vital Signs: ED Triage Vitals  Encounter Vitals Group     BP 07/22/24 1203 111/66     Girls Systolic BP Percentile --      Girls Diastolic BP Percentile --      Boys Systolic BP Percentile --      Boys Diastolic BP Percentile --      Pulse Rate 07/22/24 1203 (!) 103     Resp 07/22/24 1203 18     Temp 07/22/24 1203 100 F (37.8 C)     Temp Source 07/22/24 1203 Oral     SpO2 07/22/24 1203 97 %     Weight 07/22/24 1203 170 lb (77.1 kg)     Height 07/22/24 1203 5' 5 (1.651 m)     Head Circumference --      Peak Flow --      Pain Score 07/22/24 1206 8     Pain Loc --      Pain Education --      Exclude from Growth Chart --     Most recent vital signs: Vitals:   07/22/24 1203  BP: 111/66  Pulse: (!) 103  Resp: 18  Temp:  100 F (37.8 C)  SpO2: 97%    Physical Exam Vitals and nursing note reviewed.  Constitutional:      General: Awake and alert. No acute distress.    Appearance: Normal appearance. The patient is normal weight.  HENT:     Head: Normocephalic and atraumatic.     Mouth: Mucous membranes are moist. Uvula midline.  No tonsillar exudate.  No soft palate fluctuance.  No trismus.  No voice change.  No sublingual swelling.  No tender cervical lymphadenopathy.  No nuchal rigidity Eyes:     General: PERRL. Normal EOMs        Right eye: No discharge.        Left eye: No discharge.     Conjunctiva/sclera: Conjunctivae normal.  Cardiovascular:     Rate and Rhythm: Normal rate and regular rhythm.     Pulses: Normal pulses.  Pulmonary:     Effort: Pulmonary effort is normal. No respiratory distress.     Breath sounds: Normal breath sounds.  Abdominal:     Abdomen is soft. There is no abdominal tenderness. No rebound or guarding. No  distention. Musculoskeletal:        General: No swelling. Normal range of motion.     Cervical back: Normal range of motion and neck supple.  Skin:    General: Skin is warm and dry.     Capillary Refill: Capillary refill takes less than 2 seconds.     Findings: No rash.  Neurological:     Mental Status: The patient is awake and alert.      ED Results / Procedures / Treatments   Labs (all labs ordered are listed, but only abnormal results are displayed) Labs Reviewed  GROUP A STREP BY PCR     EKG     RADIOLOGY     PROCEDURES:  Critical Care performed:   Procedures   MEDICATIONS ORDERED IN ED: Medications - No data to display   IMPRESSION / MDM / ASSESSMENT AND PLAN / ED COURSE  I reviewed the triage vital signs and the nursing notes.   Differential diagnosis includes, but is not limited to, influenza, COVID-19, strep pharyngitis, other viral URI  Patient is awake and alert, hemodynamically stable and afebrile.  She is nontoxic in  appearance.  Uvula is midline, no tonsillar exudate, no voice change, no drooling, no mid neck pain or stiffness, not consistent with peritonsillar or retropharyngeal abscess.  We are unable to test for influenza or COVID given the current lack of COVID/flu swabs in the hospital.  However, strep swab was obtained in triage and is negative.  We discussed possibility/likelihood of influenza and symptomatic management, outpatient follow-up, and return precautions.  Patient understands and agrees with plan.  Discharged in stable condition.   Patient's presentation is most consistent with acute complicated illness / injury requiring diagnostic workup.      FINAL CLINICAL IMPRESSION(S) / ED DIAGNOSES   Final diagnoses:  Viral URI with cough     Rx / DC Orders   ED Discharge Orders     None        Note:  This document was prepared using Dragon voice recognition software and may include unintentional dictation errors.   Jaiyana Canale E, PA-C 07/22/24 1429    Suzanne Kirsch, MD 07/22/24 [phone]  "

## 2024-07-22 NOTE — Discharge Instructions (Signed)
 Please follow-up with your outpatient provider.  Please continue to take Tylenol /ibuprofen  per package instructions to help with your symptoms.  Please return for any new, worsening, or changing symptoms or other concerns.  It was a pleasure caring for you today.

## 2024-07-22 NOTE — ED Notes (Signed)
 Pt given DC instructions. Pt verbalized understanding of follow up care. Pt ambulatory from ED without difficulty.

## 2024-07-22 NOTE — ED Triage Notes (Signed)
 Pt presents to the ED via POV from home with sore throat, chills, body aches, and a cough. Pt reports sx's started yesterday. Pt A&Ox4.
